# Patient Record
Sex: Male | Born: 1960 | Race: White | Hispanic: No | Marital: Single | Smoking: Former smoker
Health system: Southern US, Community
[De-identification: ages and names within clinical notes are randomized; demographics above are authoritative.]

## PROBLEM LIST (undated history)

## (undated) DIAGNOSIS — F419 Anxiety disorder, unspecified: Secondary | ICD-10-CM

## (undated) DIAGNOSIS — I10 Essential (primary) hypertension: Secondary | ICD-10-CM

## (undated) DIAGNOSIS — M1990 Unspecified osteoarthritis, unspecified site: Secondary | ICD-10-CM

## (undated) HISTORY — PX: WRIST SURGERY: SHX841

## (undated) HISTORY — PX: KNEE ARTHROSCOPY: SUR90

## (undated) HISTORY — PX: COLONOSCOPY: SHX174

---

## 2020-05-07 ENCOUNTER — Other Ambulatory Visit: Payer: Self-pay | Admitting: Physician Assistant

## 2020-05-07 DIAGNOSIS — M25531 Pain in right wrist: Secondary | ICD-10-CM

## 2020-05-08 ENCOUNTER — Ambulatory Visit
Admission: RE | Admit: 2020-05-08 | Discharge: 2020-05-08 | Disposition: A | Discharge status: Home / Self Care | Payer: Self-pay | Source: Ambulatory Visit | Attending: Physician Assistant | Admitting: Physician Assistant

## 2020-05-08 ENCOUNTER — Other Ambulatory Visit: Payer: Self-pay

## 2020-05-08 DIAGNOSIS — M25531 Pain in right wrist: Secondary | ICD-10-CM

## 2020-10-11 ENCOUNTER — Other Ambulatory Visit: Payer: Self-pay | Admitting: Orthopedic Surgery

## 2020-10-11 DIAGNOSIS — M25561 Pain in right knee: Secondary | ICD-10-CM

## 2020-11-07 ENCOUNTER — Other Ambulatory Visit: Payer: Self-pay

## 2020-11-07 ENCOUNTER — Ambulatory Visit
Admission: RE | Admit: 2020-11-07 | Discharge: 2020-11-07 | Disposition: A | Discharge status: Home / Self Care | Payer: Self-pay | Source: Ambulatory Visit | Attending: Orthopedic Surgery | Admitting: Orthopedic Surgery

## 2020-11-07 DIAGNOSIS — M25561 Pain in right knee: Secondary | ICD-10-CM

## 2020-12-27 ENCOUNTER — Other Ambulatory Visit: Payer: Self-pay | Admitting: Orthopedic Surgery

## 2020-12-27 DIAGNOSIS — Z01818 Encounter for other preprocedural examination: Secondary | ICD-10-CM

## 2021-01-09 ENCOUNTER — Encounter (HOSPITAL_COMMUNITY): Payer: Self-pay

## 2021-01-09 NOTE — Progress Notes (Addendum)
COVID Vaccine Completed: x3 Date COVID Vaccine completed:  01-05-20 01-30-20 Has received booster: 09-12-20 COVID vaccine manufacturer: Pfizer      Date of COVID positive in last 90 days: N/A  PCP - Enzo Montgomery. [facility]er in [place] Cardiologist - 10+ years ago due to family history  Chest x-ray - 01-15-21 Epic EKG - 01-15-21 Epic Stress Test - 10+ years ago for family history ECHO -  Cardiac Cath -  Pacemaker/ICD device last checked: Spinal Cord Stimulator:  Sleep Study - N/A CPAP -   Fasting Blood Sugar - N//A Checks Blood Sugar _____ times a day  Blood Thinner Instructions:  N/A Aspirin Instructions: Last Dose:  Activity level:  Can go up a flight of stairs and perform activities of daily living without stopping and without symptoms of chest pain or shortness of breath.       Anesthesia review:  N/A  Patient denies shortness of breath, fever, cough and chest pain at PAT appointment   Patient verbalized understanding of instructions that were given to them at the PAT appointment. Patient was also instructed that they will need to review over the PAT instructions again at home before surgery.

## 2021-01-09 NOTE — Patient Instructions (Addendum)
DUE TO COVID-19 ONLY ONE VISITOR IS ALLOWED TO COME WITH YOU AND STAY IN THE WAITING ROOM ONLY DURING PRE OP AND PROCEDURE.     COVID SWAB TESTING MUST BE COMPLETED ON:  Thursday 01-23-21 @ 10:00 AM   [address] [place], [place] [address]  (Must self quarantine after testing. Follow instructions on handout.)         Your procedure is scheduled on:  Monday, 01-27-21   Report to [facility] Main  Entrance   Report to Short Stay at 5:30 AM   [facility])    Call this number if you have problems the morning of surgery [phone]   Do not eat food :After Midnight.   May have liquids until 4:15 AM day of surgery  CLEAR LIQUID DIET  Foods Allowed                                                                     Foods Excluded  Water, Black Coffee and tea, regular and decaf              liquids that you cannot  Plain Jell-O in any flavor  (No red)                                     see through such as: Fruit ices (not with fruit pulp)                                      milk, soups, orange juice              Iced Popsicles (No red)                                      All solid food                                   Apple juices Sports drinks like Gatorade (No red) Lightly seasoned clear broth or consume(fat free) Sugar, honey syrup   Complete one Ensure drink the morning of surgery at 4:15 AM the day of surgery.       1. The day of surgery:  ? Drink ONE (1) Pre-Surgery Clear Ensure or G2 by am the morning of surgery. Drink in one sitting. Do not sip.  ? This drink was given to you during your hospital  pre-op appointment visit. ? Nothing else to drink after completing the  Pre-Surgery Clear Ensure or G2.          If you have questions, please contact your surgeon's office.     Oral Hygiene is also important to reduce your risk of infection.                                    Remember - BRUSH YOUR TEETH THE MORNING OF SURGERY WITH YOUR  REGULAR  TOOTHPASTE   Do NOT smoke after Midnight   Take these medicines the morning of surgery with A SIP OF WATER:  Amlodipine                               You may not have any metal on your body including  jewelry, and body piercings             Do not wear make-up powders, perfumes/cologne, or deodorant             Men may shave face and neck.   Do not bring valuables to the hospital.  IS NOT             RESPONSIBLE   FOR VALUABLES.   Contacts, dentures or bridgework may not be worn into surgery.   Patients discharged the day of surgery will not be allowed to drive home.               Please read over the following fact sheets you were given: IF YOU HAVE QUESTIONS ABOUT YOUR PRE OP INSTRUCTIONS PLEASE CALL  [phone] [facility] - Preparing for Surgery Before surgery, you can play an important role.  Because skin is not sterile, your skin needs to be as free of germs as possible.  You can reduce the number of germs on your skin by washing with CHG (chlorahexidine gluconate) soap before surgery.  CHG is an antiseptic cleaner which kills germs and bonds with the skin to continue killing germs even after washing. Please DO NOT use if you have an allergy to CHG or antibacterial soaps.  If your skin becomes reddened/irritated stop using the CHG and inform your nurse when you arrive at Short Stay. Do not shave (including legs and underarms) for at least 48 hours prior to the first CHG shower.  You may shave your face/neck.  Please follow these instructions carefully:  1.  Shower with CHG Soap the night before surgery and the  morning of surgery.  2.  If you choose to wash your hair, wash your hair first as usual with your normal  shampoo.  3.  After you shampoo, rinse your hair and body thoroughly to remove the shampoo.                             4.  Use CHG as you would any other liquid soap.  You can apply chg directly to the skin and wash.  Gently with a scrungie or  clean washcloth.  5.  Apply the CHG Soap to your body ONLY FROM THE NECK DOWN.   Do   not use on face/ open                           Wound or open sores. Avoid contact with eyes, ears mouth and   genitals (private parts).                       Wash face,  Genitals (private parts) with your normal soap.             6.  Wash thoroughly, paying special attention to the area where your    surgery  will be performed.  7.  Thoroughly rinse your body with warm water from the neck down.  8.  DO NOT shower/wash with your normal soap after using and rinsing off the CHG Soap.                9.  Pat yourself dry with a clean towel.            10.  Wear clean pajamas.            11.  Place clean sheets on your bed the night of your first shower and do not  sleep with pets. Day of Surgery : Do not apply any lotions/deodorants the morning of surgery.  Please wear clean clothes to the hospital/surgery center.  FAILURE TO FOLLOW THESE INSTRUCTIONS MAY RESULT IN THE CANCELLATION OF YOUR SURGERY  PATIENT SIGNATURE_________________________________  NURSE SIGNATURE__________________________________  ________________________________________________________________________   Brian Mcdonald  An incentive spirometer is a tool that can help keep your lungs clear and active. This tool measures how well you are filling your lungs with each breath. Taking long deep breaths may help reverse or decrease the chance of developing breathing (pulmonary) problems (especially infection) following:  A long period of time when you are unable to move or be active. BEFORE THE PROCEDURE   If the spirometer includes an indicator to show your best effort, your nurse or respiratory therapist will set it to a desired goal.  If possible, sit up straight or lean slightly forward. Try not to slouch.  Hold the incentive spirometer in an upright position. INSTRUCTIONS FOR USE  1. Sit on the edge of your bed if possible, or  sit up as far as you can in bed or on a chair. 2. Hold the incentive spirometer in an upright position. 3. Breathe out normally. 4. Place the mouthpiece in your mouth and seal your lips tightly around it. 5. Breathe in slowly and as deeply as possible, raising the piston or the ball toward the top of the column. 6. Hold your breath for 3-5 seconds or for as long as possible. Allow the piston or ball to fall to the bottom of the column. 7. Remove the mouthpiece from your mouth and breathe out normally. 8. Rest for a few seconds and repeat Steps 1 through 7 at least 10 times every 1-2 hours when you are awake. Take your time and take a few normal breaths between deep breaths. 9. The spirometer may include an indicator to show your best effort. Use the indicator as a goal to work toward during each repetition. 10. After each set of 10 deep breaths, practice coughing to be sure your lungs are clear. If you have an incision (the cut made at the time of surgery), support your incision when coughing by placing a pillow or rolled up towels firmly against it. Once you are able to get out of bed, walk around indoors and cough well. You may stop using the incentive spirometer when instructed by your caregiver.  RISKS AND COMPLICATIONS  Take your time so you do not get dizzy or light-headed.  If you are in pain, you may need to take or ask for pain medication before doing incentive spirometry. It is harder to take a deep breath if you are having pain. AFTER USE  Rest and breathe slowly and easily.  It can be helpful to keep track of a log of your progress. Your caregiver can provide you with a simple table to help with this. If you are using the spirometer at home, follow these instructions: [place] IF:   You  are having difficultly using the spirometer.  You have trouble using the spirometer as often as instructed.  Your pain medication is not giving enough relief while using the  spirometer.  You develop fever of 100.5 F (38.1 C) or higher. SEEK IMMEDIATE MEDICAL CARE IF:   You cough up bloody sputum that had not been present before.  You develop fever of 102 F (38.9 C) or greater.  You develop worsening pain at or near the incision site. MAKE SURE YOU:   Understand these instructions.  Will watch your condition.  Will get help right away if you are not doing well or get worse. Document Released: 03/01/2007 Document Revised: 01/11/2012 Document Reviewed: 05/02/2007 Exit[employer] Patient Information 2014 [employer], [place].   ________________________________________________________________________  WHAT IS A BLOOD TRANSFUSION? Blood Transfusion Information  A transfusion is the replacement of blood or some of its parts. Blood is made up of multiple cells which provide different functions.  Red blood cells carry oxygen and are used for blood loss replacement.  White blood cells fight against infection.  Platelets control bleeding.  Plasma helps clot blood.  Other blood products are available for specialized needs, such as hemophilia or other clotting disorders. BEFORE THE TRANSFUSION  Who gives blood for transfusions?   Healthy volunteers who are fully evaluated to make sure their blood is safe. This is blood bank blood. Transfusion therapy is the safest it has ever been in the practice of medicine. Before blood is taken from a donor, a complete history is taken to make sure that person has no history of diseases nor engages in risky social behavior (examples are intravenous drug use or sexual activity with multiple partners). The donor's travel history is screened to minimize risk of transmitting infections, such as malaria. The donated blood is tested for signs of infectious diseases, such as HIV and hepatitis. The blood is then tested to be sure it is compatible with you in order to minimize the chance of a transfusion reaction. If you or a relative  donates blood, this is often done in anticipation of surgery and is not appropriate for emergency situations. It takes many days to process the donated blood. RISKS AND COMPLICATIONS Although transfusion therapy is very safe and saves many lives, the main dangers of transfusion include:   Getting an infectious disease.  Developing a transfusion reaction. This is an allergic reaction to something in the blood you were given. Every precaution is taken to prevent this. The decision to have a blood transfusion has been considered carefully by your caregiver before blood is given. Blood is not given unless the benefits outweigh the risks. AFTER THE TRANSFUSION  Right after receiving a blood transfusion, you will usually feel much better and more energetic. This is especially true if your red blood cells have gotten low (anemic). The transfusion raises the level of the red blood cells which carry oxygen, and this usually causes an energy increase.  The nurse administering the transfusion will monitor you carefully for complications. HOME CARE INSTRUCTIONS  No special instructions are needed after a transfusion. You may find your energy is better. Speak with your caregiver about any limitations on activity for underlying diseases you may have. SEEK MEDICAL CARE IF:   Your condition is not improving after your transfusion.  You develop redness or irritation at the intravenous (IV) site. SEEK IMMEDIATE MEDICAL CARE IF:  Any of the following symptoms occur over the next 12 hours:  Shaking chills.  You have a temperature by  mouth above 102 F (38.9 C), not controlled by medicine.  Chest, back, or muscle pain.  People around you feel you are not acting correctly or are confused.  Shortness of breath or difficulty breathing.  Dizziness and fainting.  You get a rash or develop hives.  You have a decrease in urine output.  Your urine turns a dark color or changes to pink, red, or brown. Any  of the following symptoms occur over the next 10 days:  You have a temperature by mouth above 102 F (38.9 C), not controlled by medicine.  Shortness of breath.  Weakness after normal activity.  The white part of the eye turns yellow (jaundice).  You have a decrease in the amount of urine or are urinating less often.  Your urine turns a dark color or changes to pink, red, or brown. Document Released: 10/16/2000 Document Revised: 01/11/2012 Document Reviewed: 06/04/2008 [facility] Patient Information 2014 [place], [place].  _______________________________________________________________________

## 2021-01-15 ENCOUNTER — Encounter (HOSPITAL_COMMUNITY)
Admission: RE | Admit: 2021-01-15 | Discharge: 2021-01-15 | Disposition: A | Discharge status: Home / Self Care | Payer: No Typology Code available for payment source | Source: Ambulatory Visit | Attending: Orthopedic Surgery | Admitting: Orthopedic Surgery

## 2021-01-15 ENCOUNTER — Ambulatory Visit (HOSPITAL_COMMUNITY)
Admission: RE | Admit: 2021-01-15 | Discharge: 2021-01-15 | Disposition: A | Discharge status: Home / Self Care | Payer: No Typology Code available for payment source | Source: Ambulatory Visit | Attending: Orthopedic Surgery | Admitting: Orthopedic Surgery

## 2021-01-15 ENCOUNTER — Encounter (HOSPITAL_COMMUNITY): Payer: Self-pay

## 2021-01-15 ENCOUNTER — Other Ambulatory Visit: Payer: Self-pay

## 2021-01-15 DIAGNOSIS — Z01818 Encounter for other preprocedural examination: Secondary | ICD-10-CM

## 2021-01-15 HISTORY — DX: Unspecified osteoarthritis, unspecified site: M19.90

## 2021-01-15 HISTORY — DX: Anxiety disorder, unspecified: F41.9

## 2021-01-15 HISTORY — DX: Essential (primary) hypertension: I10

## 2021-01-15 LAB — BASIC METABOLIC PANEL
Anion gap: 8 (ref 5–15)
BUN: 16 mg/dL (ref 6–20)
CO2: 24 mmol/L (ref 22–32)
Calcium: 9.2 mg/dL (ref 8.9–10.3)
Chloride: 104 mmol/L (ref 98–111)
Creatinine, Ser: 0.88 mg/dL (ref 0.61–1.24)
GFR, Estimated: >60 mL/min (ref >60)
Glucose, Bld: 85 mg/dL (ref 70–99)
Potassium: 4.2 mmol/L (ref 3.5–5.1)
Sodium: 136 mmol/L (ref 135–145)

## 2021-01-15 LAB — APTT: aPTT: 28 seconds (ref 24–36)

## 2021-01-15 LAB — URINALYSIS, ROUTINE W REFLEX MICROSCOPIC
Bilirubin Urine: NEGATIVE
Glucose, UA: NEGATIVE mg/dL
Hgb urine dipstick: NEGATIVE
Ketones, ur: 5 mg/dL — AB
Leukocytes,Ua: NEGATIVE
Nitrite: NEGATIVE
Protein, ur: NEGATIVE mg/dL
Specific Gravity, Urine: 1.028 (ref 1.005–1.030)
pH: 5 (ref 5.0–8.0)

## 2021-01-15 LAB — TYPE AND SCREEN
ABO/RH(D): B POS
Antibody Screen: NEGATIVE

## 2021-01-15 LAB — CBC WITH DIFFERENTIAL/PLATELET
Abs Immature Granulocytes: 0.02 10*3/uL (ref 0.00–0.07)
Basophils Absolute: 0.1 10*3/uL (ref 0.0–0.1)
Basophils Relative: 1 %
Eosinophils Absolute: 0.1 10*3/uL (ref 0.0–0.5)
Eosinophils Relative: 2 %
HCT: 46.5 % (ref 39.0–52.0)
Hemoglobin: 15.7 g/dL (ref 13.0–17.0)
Immature Granulocytes: 0 %
Lymphocytes Relative: 20 %
Lymphs Abs: 1.6 10*3/uL (ref 0.7–4.0)
MCH: 29.9 pg (ref 26.0–34.0)
MCHC: 33.8 g/dL (ref 30.0–36.0)
MCV: 88.6 fL (ref 80.0–100.0)
Monocytes Absolute: 0.6 10*3/uL (ref 0.1–1.0)
Monocytes Relative: 8 %
Neutro Abs: 5.3 10*3/uL (ref 1.7–7.7)
Neutrophils Relative %: 69 %
Platelets: 318 10*3/uL (ref 150–400)
RBC: 5.25 MIL/uL (ref 4.22–5.81)
RDW: 12.4 % (ref 11.5–15.5)
WBC: 7.7 10*3/uL (ref 4.0–10.5)
nRBC: 0 % (ref 0.0–0.2)

## 2021-01-15 LAB — SURGICAL PCR SCREEN
MRSA, PCR: NEGATIVE
Staphylococcus aureus: POSITIVE — AB

## 2021-01-15 LAB — PROTIME-INR
INR: 1 (ref 0.8–1.2)
Prothrombin Time: 12.4 seconds (ref 11.4–15.2)

## 2021-01-15 NOTE — Progress Notes (Signed)
PCR results sent to Dr. Rowan to review. 

## 2021-01-23 ENCOUNTER — Other Ambulatory Visit (HOSPITAL_COMMUNITY)
Admission: RE | Admit: 2021-01-23 | Discharge: 2021-01-23 | Disposition: A | Discharge status: Home / Self Care | Payer: No Typology Code available for payment source | Source: Ambulatory Visit | Attending: Orthopedic Surgery | Admitting: Orthopedic Surgery

## 2021-01-23 DIAGNOSIS — Z20822 Contact with and (suspected) exposure to covid-19: Secondary | ICD-10-CM | POA: Diagnosis not present

## 2021-01-23 DIAGNOSIS — Z01812 Encounter for preprocedural laboratory examination: Secondary | ICD-10-CM | POA: Diagnosis present

## 2021-01-23 DIAGNOSIS — M1711 Unilateral primary osteoarthritis, right knee: Secondary | ICD-10-CM | POA: Diagnosis present

## 2021-01-23 LAB — SARS CORONAVIRUS 2 (TAT 6-24 HRS): SARS Coronavirus 2: NEGATIVE

## 2021-01-23 NOTE — H&P (Signed)
TOTAL KNEE ADMISSION H&P  Patient is being admitted for right total knee arthroplasty.  Subjective:  Chief Complaint:right knee pain.  HPI: Brian Mcdonald, 60 y.o. male, has a history of pain and functional disability in the right knee due to trauma and arthritis and has failed non-surgical conservative treatments for greater than 12 weeks to includeNSAID's and/or analgesics, corticosteriod injections, flexibility and strengthening excercises, supervised PT with diminished ADL's post treatment and activity modification.  Onset of symptoms was abrupt, starting 1 years ago with rapidlly worsening course since that time. The patient noted prior procedures on the knee to include  arthroscopy on the right knee(s).  Patient currently rates pain in the right knee(s) at 10 out of 10 with activity. Patient has night pain, worsening of pain with activity and weight bearing, pain that interferes with activities of daily living, pain with passive range of motion, crepitus and joint swelling.  Patient has evidence of joint space narrowing by imaging studies.  There is no active infection.  Patient Active Problem List   Diagnosis Date Noted  . Osteoarthritis of right knee 01/23/2021   Past Medical History:  Diagnosis Date  . Anxiety   . Arthritis   . Hypertension     Past Surgical History:  Procedure Laterality Date  . COLONOSCOPY    . KNEE ARTHROSCOPY Right   . WRIST SURGERY Right   . WRIST SURGERY Left     No current facility-administered medications for this encounter.   Current Outpatient Medications  Medication Sig Dispense Refill Last Dose  . acetaminophen (TYLENOL) 500 MG tablet Take 1,000 mg by mouth every 6 (six) hours as needed (for pain).     Marland Kitchen amLODipine (NORVASC) 5 MG tablet Take 5 mg by mouth daily.     . Cholecalciferol (VITAMIN D3) 10 MCG (400 UNIT) tablet Take 400 Units by mouth daily.     Marland Kitchen escitalopram (LEXAPRO) 10 MG tablet Take 10 mg by mouth at bedtime.     Marland Kitchen ibuprofen  (ADVIL) 200 MG tablet Take 400 mg by mouth every 8 (eight) hours as needed (for pain).     Marland Kitchen lisinopril (ZESTRIL) 30 MG tablet Take 30 mg by mouth daily.     . vitamin C (ASCORBIC ACID) 500 MG tablet Take 500 mg by mouth daily.      No Known Allergies  Social History   Tobacco Use  . Smoking status: Former Games developer  . Smokeless tobacco: Never Used  . Tobacco comment: Quit 20 years ago  Substance Use Topics  . Alcohol use: Never    No family history on file.   Review of Systems  Constitutional: Negative.   HENT: Negative.   Eyes: Negative.   Respiratory: Negative.   Cardiovascular:       HTN  Gastrointestinal: Negative.   Endocrine: Negative.   Genitourinary: Negative.   Musculoskeletal: Positive for arthralgias and myalgias.  Skin: Negative.   Allergic/Immunologic: Negative.   Neurological: Negative.   Hematological: Negative.   Psychiatric/Behavioral: Negative.     Objective:  Physical Exam Constitutional:      Appearance: Normal appearance. He is normal weight.  HENT:     Head: Normocephalic and atraumatic.     Nose: Nose normal.  Eyes:     Pupils: Pupils are equal, round, and reactive to light.  Cardiovascular:     Pulses: Normal pulses.  Pulmonary:     Effort: Pulmonary effort is normal.  Musculoskeletal:        General: Tenderness present.  Cervical back: Normal range of motion and neck supple.     Comments: Today the patient has a 1+ effusion in the knee mild valgus deformity pain laterally to palpation and a flexion past 120.  Collateral ligaments are stable to valgus stress exacerbates his pain.  Neurovascular intact distally toes are pink and well perfused good quadriceps and hamstring power.  He does walk with a right-sided limp.  Neurological:     General: No focal deficit present.     Mental Status: He is alert and oriented to person, place, and time. Mental status is at baseline.  Psychiatric:        Mood and Affect: Mood normal.         Behavior: Behavior normal.        Thought Content: Thought content normal.        Judgment: Judgment normal.     Vital signs in last 24 hours:    Labs:   Estimated body mass index is 31.33 kg/m as calculated from the following:   Height as of 01/15/21: 6' (1.829 m).   Weight as of 01/15/21: 104.8 kg.   Imaging Review Plain radiographs demonstrate  AP and Zoila Shutter x-rays taken today show partial collapse of the lateral femoral condyle on the Zoila Shutter view that was not present when x-rays are done back in July 2021 when we first saw him.  This represents rapid progression of arthritis with loss of cartilage and bone.   Assessment/Plan:  End stage arthritis, right knee   The patient history, physical examination, clinical judgment of the provider and imaging studies are consistent with end stage degenerative joint disease of the right knee(s) and total knee arthroplasty is deemed medically necessary. The treatment options including medical management, injection therapy arthroscopy and arthroplasty were discussed at length. The risks and benefits of total knee arthroplasty were presented and reviewed. The risks due to aseptic loosening, infection, stiffness, patella tracking problems, thromboembolic complications and other imponderables were discussed. The patient acknowledged the explanation, agreed to proceed with the plan and consent was signed. Patient is being admitted for inpatient treatment for surgery, pain control, PT, OT, prophylactic antibiotics, VTE prophylaxis, progressive ambulation and ADL's and discharge planning. The patient is planning to be discharged home with home health services     Patient's anticipated LOS is less than 2 midnights, meeting these requirements: - Younger than 43 - Lives within 1 hour of care - Has a competent adult at home to recover with post-op recover - NO history of  - Chronic pain requiring opiods  - Diabetes  - Coronary Artery Disease  -  Heart failure  - Heart attack  - Stroke  - DVT/VTE  - Cardiac arrhythmia  - Respiratory Failure/COPD  - Renal failure  - Anemia  - Advanced Liver disease

## 2021-01-25 NOTE — Anesthesia Preprocedure Evaluation (Addendum)
Anesthesia Evaluation  Patient identified by MRN, date of birth, ID band Patient awake    Reviewed: Allergy & Precautions, NPO status , Patient's Chart, lab work & pertinent test results  Airway Mallampati: II  TM Distance: >3 FB Neck ROM: Full    Dental no notable dental hx. (+) Partial Upper,    Pulmonary former smoker,    Pulmonary exam normal breath sounds clear to auscultation       Cardiovascular hypertension, Pt. on medications Normal cardiovascular exam Rhythm:Regular Rate:Normal  EKG 01-15-21 SR R 67    Neuro/Psych Anxiety negative neurological ROS     GI/Hepatic Neg liver ROS,   Endo/Other  negative endocrine ROS  Renal/GU negative Renal ROSK+ 4.2     Musculoskeletal  (+) Arthritis ,   Abdominal (+) + obese,   Peds  Hematology negative hematology ROS (+) Hgb 15.7 Plt 318   Anesthesia Other Findings   Reproductive/Obstetrics negative OB ROS                            Anesthesia Physical Anesthesia Plan  ASA: II  Anesthesia Plan: Spinal   Post-op Pain Management:    Induction:   PONV Risk Score and Plan: 2 and Treatment may vary due to age or medical condition  Airway Management Planned: Natural Airway and Nasal Cannula  Additional Equipment: None  Intra-op Plan:   Post-operative Plan:   Informed Consent: I have reviewed the patients History and Physical, chart, labs and discussed the procedure including the risks, benefits and alternatives for the proposed anesthesia with the patient or authorized representative who has indicated his/her understanding and acceptance.     Dental advisory given  Plan Discussed with: Anesthesiologist and CRNA  Anesthesia Plan Comments: (SP w R adductor canal Block)       Anesthesia Quick Evaluation

## 2021-01-26 MED ORDER — TRANEXAMIC ACID 1000 MG/10ML IV SOLN
2000.0000 mg | INTRAVENOUS | Status: DC
  Filled 2021-01-26: qty 20

## 2021-01-26 MED ORDER — BUPIVACAINE LIPOSOME 1.3 % IJ SUSP
20.0000 mL | INTRAMUSCULAR | Status: DC
  Filled 2021-01-26: qty 20

## 2021-01-27 ENCOUNTER — Ambulatory Visit (HOSPITAL_COMMUNITY)
Admission: RE | Admit: 2021-01-27 | Discharge: 2021-01-27 | Disposition: A | Discharge status: Home / Self Care | Payer: No Typology Code available for payment source | Source: Ambulatory Visit | Attending: Orthopedic Surgery | Admitting: Orthopedic Surgery

## 2021-01-27 ENCOUNTER — Encounter (HOSPITAL_COMMUNITY): Payer: Self-pay | Admitting: Orthopedic Surgery

## 2021-01-27 ENCOUNTER — Encounter (HOSPITAL_COMMUNITY)
Admission: RE | Disposition: A | Discharge status: Home / Self Care | Payer: Self-pay | Source: Ambulatory Visit | Attending: Orthopedic Surgery

## 2021-01-27 ENCOUNTER — Ambulatory Visit (HOSPITAL_COMMUNITY): Payer: No Typology Code available for payment source | Admitting: Anesthesiology

## 2021-01-27 DIAGNOSIS — Z79899 Other long term (current) drug therapy: Secondary | ICD-10-CM | POA: Insufficient documentation

## 2021-01-27 DIAGNOSIS — M1711 Unilateral primary osteoarthritis, right knee: Secondary | ICD-10-CM | POA: Diagnosis present

## 2021-01-27 DIAGNOSIS — M6281 Muscle weakness (generalized): Secondary | ICD-10-CM | POA: Insufficient documentation

## 2021-01-27 DIAGNOSIS — Z87891 Personal history of nicotine dependence: Secondary | ICD-10-CM | POA: Insufficient documentation

## 2021-01-27 HISTORY — PX: TOTAL KNEE ARTHROPLASTY: SHX125

## 2021-01-27 LAB — ABO/RH: ABO/RH(D): B POS

## 2021-01-27 SURGERY — ARTHROPLASTY, KNEE, TOTAL
Anesthesia: Spinal | Site: Knee | Laterality: Right

## 2021-01-27 MED ORDER — OXYCODONE HCL 5 MG PO TABS
5.0000 mg | ORAL_TABLET | Freq: Once | ORAL | Status: AC | PRN
  Administered 2021-01-27: 5 mg via ORAL

## 2021-01-27 MED ORDER — LACTATED RINGERS IV SOLN: INTRAVENOUS | Status: DC

## 2021-01-27 MED ORDER — DEXAMETHASONE SODIUM PHOSPHATE 10 MG/ML IJ SOLN
INTRAMUSCULAR | Status: AC
  Filled 2021-01-27: qty 1

## 2021-01-27 MED ORDER — BUPIVACAINE-EPINEPHRINE (PF) 0.25% -1:200000 IJ SOLN
INTRAMUSCULAR | Status: AC
  Filled 2021-01-27: qty 30

## 2021-01-27 MED ORDER — ONDANSETRON HCL 4 MG/2ML IJ SOLN
INTRAMUSCULAR | Status: AC
  Filled 2021-01-27: qty 2

## 2021-01-27 MED ORDER — OXYCODONE HCL 5 MG PO TABS
ORAL_TABLET | ORAL | Status: AC
  Filled 2021-01-27: qty 1

## 2021-01-27 MED ORDER — DEXAMETHASONE SODIUM PHOSPHATE 10 MG/ML IJ SOLN
INTRAMUSCULAR | Status: DC | PRN
  Administered 2021-01-27: 5 mg via INTRAVENOUS

## 2021-01-27 MED ORDER — CLONIDINE HCL (ANALGESIA) 100 MCG/ML EP SOLN
EPIDURAL | Status: DC | PRN
  Administered 2021-01-27: 100 ug

## 2021-01-27 MED ORDER — TRANEXAMIC ACID 1000 MG/10ML IV SOLN
INTRAVENOUS | Status: DC | PRN
  Administered 2021-01-27: 2000 mg via TOPICAL

## 2021-01-27 MED ORDER — BUPIVACAINE-EPINEPHRINE (PF) 0.25% -1:200000 IJ SOLN
INTRAMUSCULAR | Status: DC | PRN
  Administered 2021-01-27: 30 mL

## 2021-01-27 MED ORDER — ONDANSETRON HCL 4 MG/2ML IJ SOLN
INTRAMUSCULAR | Status: DC | PRN
  Administered 2021-01-27: 4 mg via INTRAVENOUS

## 2021-01-27 MED ORDER — PROPOFOL 500 MG/50ML IV EMUL
INTRAVENOUS | Status: AC
  Filled 2021-01-27: qty 50

## 2021-01-27 MED ORDER — CHLORHEXIDINE GLUCONATE 0.12 % MT SOLN
15.0000 mL | Freq: Once | OROMUCOSAL | Status: AC
  Administered 2021-01-27: 15 mL via OROMUCOSAL

## 2021-01-27 MED ORDER — PROPOFOL 10 MG/ML IV BOLUS
INTRAVENOUS | Status: AC
  Filled 2021-01-27: qty 20

## 2021-01-27 MED ORDER — AMISULPRIDE (ANTIEMETIC) 5 MG/2ML IV SOLN: 10.0000 mg | Freq: Once | INTRAVENOUS | Status: DC | PRN

## 2021-01-27 MED ORDER — ACETAMINOPHEN 10 MG/ML IV SOLN: 1000.0000 mg | Freq: Once | INTRAVENOUS | Status: DC | PRN

## 2021-01-27 MED ORDER — SODIUM CHLORIDE (PF) 0.9 % IJ SOLN
INTRAMUSCULAR | Status: AC
  Filled 2021-01-27: qty 200

## 2021-01-27 MED ORDER — LACTATED RINGERS IV BOLUS
500.0000 mL | Freq: Once | INTRAVENOUS | Status: AC
  Administered 2021-01-27: 500 mL via INTRAVENOUS

## 2021-01-27 MED ORDER — ONDANSETRON HCL 4 MG/2ML IJ SOLN: 4.0000 mg | Freq: Once | INTRAMUSCULAR | Status: DC | PRN

## 2021-01-27 MED ORDER — METHOCARBAMOL 500 MG IVPB - SIMPLE MED: 500.0000 mg | Freq: Four times a day (QID) | INTRAVENOUS | Status: DC | PRN

## 2021-01-27 MED ORDER — BUPIVACAINE IN DEXTROSE 0.75-8.25 % IT SOLN
INTRATHECAL | Status: DC | PRN
  Administered 2021-01-27: 1.7 mL via INTRATHECAL

## 2021-01-27 MED ORDER — SODIUM CHLORIDE 0.9 % IR SOLN
Status: DC | PRN
  Administered 2021-01-27: 1000 mL

## 2021-01-27 MED ORDER — PROPOFOL 500 MG/50ML IV EMUL
INTRAVENOUS | Status: DC | PRN
  Administered 2021-01-27: 75 ug/kg/min via INTRAVENOUS

## 2021-01-27 MED ORDER — PROPOFOL 10 MG/ML IV BOLUS
INTRAVENOUS | Status: DC | PRN
  Administered 2021-01-27 (×2): 20 mg via INTRAVENOUS

## 2021-01-27 MED ORDER — FENTANYL CITRATE (PF) 100 MCG/2ML IJ SOLN
INTRAMUSCULAR | Status: AC
  Filled 2021-01-27: qty 2

## 2021-01-27 MED ORDER — POVIDONE-IODINE 10 % EX SWAB
2.0000 "application " | Freq: Once | CUTANEOUS | Status: AC
  Administered 2021-01-27: 2 via TOPICAL

## 2021-01-27 MED ORDER — MIDAZOLAM HCL 2 MG/2ML IJ SOLN
INTRAMUSCULAR | Status: AC
  Filled 2021-01-27: qty 2

## 2021-01-27 MED ORDER — FENTANYL CITRATE (PF) 100 MCG/2ML IJ SOLN
INTRAMUSCULAR | Status: DC | PRN
  Administered 2021-01-27 (×2): 50 ug via INTRAVENOUS

## 2021-01-27 MED ORDER — ROPIVACAINE HCL 7.5 MG/ML IJ SOLN
INTRAMUSCULAR | Status: DC | PRN
  Administered 2021-01-27: 20 mL via PERINEURAL

## 2021-01-27 MED ORDER — OXYCODONE-ACETAMINOPHEN 5-325 MG PO TABS: 1.0000 | ORAL_TABLET | ORAL | 0 refills | Status: AC | PRN

## 2021-01-27 MED ORDER — BUPIVACAINE LIPOSOME 1.3 % IJ SUSP
INTRAMUSCULAR | Status: DC | PRN
  Administered 2021-01-27: 20 mL

## 2021-01-27 MED ORDER — TRANEXAMIC ACID-NACL 1000-0.7 MG/100ML-% IV SOLN
INTRAVENOUS | Status: AC
  Filled 2021-01-27: qty 100

## 2021-01-27 MED ORDER — TIZANIDINE HCL 2 MG PO TABS: 2.0000 mg | ORAL_TABLET | Freq: Four times a day (QID) | ORAL | 0 refills | Status: AC | PRN

## 2021-01-27 MED ORDER — OXYCODONE HCL 5 MG/5ML PO SOLN: 5.0000 mg | Freq: Once | ORAL | Status: AC | PRN

## 2021-01-27 MED ORDER — EPHEDRINE 5 MG/ML INJ
INTRAVENOUS | Status: AC
  Filled 2021-01-27: qty 10

## 2021-01-27 MED ORDER — TRANEXAMIC ACID-NACL 1000-0.7 MG/100ML-% IV SOLN
1000.0000 mg | INTRAVENOUS | Status: AC
  Administered 2021-01-27: 1000 mg via INTRAVENOUS
  Filled 2021-01-27: qty 100

## 2021-01-27 MED ORDER — MIDAZOLAM HCL 5 MG/5ML IJ SOLN
INTRAMUSCULAR | Status: DC | PRN
  Administered 2021-01-27 (×2): 1 mg via INTRAVENOUS

## 2021-01-27 MED ORDER — ORAL CARE MOUTH RINSE: 15.0000 mL | Freq: Once | OROMUCOSAL | Status: AC

## 2021-01-27 MED ORDER — TRANEXAMIC ACID-NACL 1000-0.7 MG/100ML-% IV SOLN
1000.0000 mg | Freq: Once | INTRAVENOUS | Status: AC
  Administered 2021-01-27: 1000 mg via INTRAVENOUS

## 2021-01-27 MED ORDER — SODIUM CHLORIDE (PF) 0.9 % IJ SOLN
INTRAMUSCULAR | Status: DC | PRN
  Administered 2021-01-27: 70 mL

## 2021-01-27 MED ORDER — 0.9 % SODIUM CHLORIDE (POUR BTL) OPTIME
TOPICAL | Status: DC | PRN
  Administered 2021-01-27: 1000 mL

## 2021-01-27 MED ORDER — EPHEDRINE SULFATE-NACL 50-0.9 MG/10ML-% IV SOSY
PREFILLED_SYRINGE | INTRAVENOUS | Status: DC | PRN
  Administered 2021-01-27 (×3): 10 mg via INTRAVENOUS

## 2021-01-27 MED ORDER — ASPIRIN EC 81 MG PO TBEC: 81.0000 mg | DELAYED_RELEASE_TABLET | Freq: Two times a day (BID) | ORAL | 0 refills | Status: AC

## 2021-01-27 MED ORDER — METHOCARBAMOL 500 MG PO TABS: 500.0000 mg | ORAL_TABLET | Freq: Four times a day (QID) | ORAL | Status: DC | PRN

## 2021-01-27 MED ORDER — LACTATED RINGERS IV BOLUS
250.0000 mL | Freq: Once | INTRAVENOUS | Status: AC
  Administered 2021-01-27: 250 mL via INTRAVENOUS

## 2021-01-27 MED ORDER — WATER FOR IRRIGATION, STERILE IR SOLN
Status: DC | PRN
  Administered 2021-01-27: 2000 mL

## 2021-01-27 MED ORDER — CEFAZOLIN SODIUM-DEXTROSE 2-4 GM/100ML-% IV SOLN
2.0000 g | INTRAVENOUS | Status: AC
  Administered 2021-01-27: 2 g via INTRAVENOUS
  Filled 2021-01-27: qty 100

## 2021-01-27 MED ORDER — HYDROMORPHONE HCL 1 MG/ML IJ SOLN: 0.2500 mg | INTRAMUSCULAR | Status: DC | PRN

## 2021-01-27 SURGICAL SUPPLY — 51 items
ATTUNE MED DOME PAT 41 KNEE (Knees) ×2 IMPLANT
ATTUNE PS FEM RT SZ 8 CEM KNEE (Femur) ×2 IMPLANT
ATTUNE PSRP INSR SZ8 5 KNEE (Insert) ×2 IMPLANT
BAG DECANTER FOR FLEXI CONT (MISCELLANEOUS) ×2 IMPLANT
BAG ZIPLOCK 12X15 (MISCELLANEOUS) ×2 IMPLANT
BASE TIBIAL ROT PLAT SZ 8 KNEE (Knees) ×1 IMPLANT
BLADE SAG 18X100X1.27 (BLADE) ×2 IMPLANT
BLADE SAW SGTL 11.0X1.19X90.0M (BLADE) ×2 IMPLANT
BLADE SURG SZ10 CARB STEEL (BLADE) ×4 IMPLANT
BNDG ELASTIC 6X10 VLCR STRL LF (GAUZE/BANDAGES/DRESSINGS) ×2 IMPLANT
BNDG ELASTIC 6X5.8 VLCR STR LF (GAUZE/BANDAGES/DRESSINGS) ×2 IMPLANT
BOWL SMART MIX CTS (DISPOSABLE) ×2 IMPLANT
CEMENT HV SMART SET (Cement) ×4 IMPLANT
COVER SURGICAL LIGHT HANDLE (MISCELLANEOUS) ×2 IMPLANT
COVER WAND RF STERILE (DRAPES) IMPLANT
CUFF TOURN SGL QUICK 34 (TOURNIQUET CUFF) ×1
CUFF TRNQT CYL 34X4.125X (TOURNIQUET CUFF) ×1 IMPLANT
DECANTER SPIKE VIAL GLASS SM (MISCELLANEOUS) ×6 IMPLANT
DRAPE ORTHO SPLIT 77X108 STRL (DRAPES)
DRAPE SURG ORHT 6 SPLT 77X108 (DRAPES) IMPLANT
DRAPE U-SHAPE 47X51 STRL (DRAPES) ×2 IMPLANT
DRSG AQUACEL AG ADV 3.5X10 (GAUZE/BANDAGES/DRESSINGS) ×2 IMPLANT
DURAPREP 26ML APPLICATOR (WOUND CARE) ×2 IMPLANT
ELECT REM PT RETURN 15FT ADLT (MISCELLANEOUS) ×2 IMPLANT
GLOVE SRG 8 PF TXTR STRL LF DI (GLOVE) ×1 IMPLANT
GLOVE SURG ENC MOIS LTX SZ7.5 (GLOVE) ×2 IMPLANT
GLOVE SURG ENC MOIS LTX SZ8.5 (GLOVE) ×2 IMPLANT
GLOVE SURG UNDER POLY LF SZ8 (GLOVE) ×1
GLOVE SURG UNDER POLY LF SZ9 (GLOVE) ×2 IMPLANT
GOWN STRL REUS W/TWL XL LVL3 (GOWN DISPOSABLE) ×4 IMPLANT
HANDPIECE INTERPULSE COAX TIP (DISPOSABLE) ×1
HOOD PEEL AWAY FLYTE STAYCOOL (MISCELLANEOUS) ×6 IMPLANT
KIT TURNOVER KIT A (KITS) ×2 IMPLANT
NEEDLE HYPO 21X1.5 SAFETY (NEEDLE) ×4 IMPLANT
NS IRRIG 1000ML POUR BTL (IV SOLUTION) ×2 IMPLANT
PACK ICE MAXI GEL EZY WRAP (MISCELLANEOUS) ×2 IMPLANT
PACK TOTAL KNEE CUSTOM (KITS) ×2 IMPLANT
PENCIL SMOKE EVACUATOR (MISCELLANEOUS) IMPLANT
PIN DRILL FIX HALF THREAD (BIT) ×2 IMPLANT
PIN STEINMAN FIXATION KNEE (PIN) ×2 IMPLANT
PROTECTOR NERVE ULNAR (MISCELLANEOUS) ×2 IMPLANT
SET HNDPC FAN SPRY TIP SCT (DISPOSABLE) ×1 IMPLANT
SUT VIC AB 1 CTX 36 (SUTURE) ×1
SUT VIC AB 1 CTX36XBRD ANBCTR (SUTURE) ×1 IMPLANT
SUT VIC AB 3-0 CT1 27 (SUTURE) ×3
SUT VIC AB 3-0 CT1 TAPERPNT 27 (SUTURE) ×3 IMPLANT
SYR CONTROL 10ML LL (SYRINGE) ×4 IMPLANT
TIBIAL BASE ROT PLAT SZ 8 KNEE (Knees) ×2 IMPLANT
TRAY FOLEY MTR SLVR 16FR STAT (SET/KITS/TRAYS/PACK) ×2 IMPLANT
WATER STERILE IRR 1000ML POUR (IV SOLUTION) ×4 IMPLANT
WRAP KNEE MAXI GEL POST OP (GAUZE/BANDAGES/DRESSINGS) ×2 IMPLANT

## 2021-01-27 NOTE — Transfer of Care (Signed)
Immediate Anesthesia Transfer of Care Note  Patient: Brian Mcdonald  Procedure(s) Performed: RIGHT TOTAL KNEE ARTHROPLASTY (Right Knee)  Patient Location: PACU  Anesthesia Type:Spinal and MAC combined with regional for post-op pain  Level of Consciousness: awake, alert  and oriented  Airway & Oxygen Therapy: Patient Spontanous Breathing and Patient connected to face mask oxygen  Post-op Assessment: Report given to RN and Post -op Vital signs reviewed and stable  Post vital signs: Reviewed and stable  Last Vitals:  Vitals Value Taken Time  BP 103/64 01/27/21 0911  Temp    Pulse 64 01/27/21 0913  Resp 13 01/27/21 0913  SpO2 99 % 01/27/21 0913  Vitals shown include unvalidated device data.  Last Pain:  Vitals:   01/27/21 0619  TempSrc:   PainSc: 0-No pain         Complications: No complications documented.

## 2021-01-27 NOTE — Interval H&P Note (Signed)
History and Physical Interval Note:  01/27/2021 6:53 AM  Brian Mcdonald  has presented today for surgery, with the diagnosis of RIGHT KNEE OSTEOARTHRITIS.  The various methods of treatment have been discussed with the patient and family. After consideration of risks, benefits and other options for treatment, the patient has consented to  Procedure(s): RIGHT TOTAL KNEE ARTHROPLASTY (Right) as a surgical intervention.  The patient's history has been reviewed, patient examined, no change in status, stable for surgery.  I have reviewed the patient's chart and labs.  Questions were answered to the patient's satisfaction.     [name]

## 2021-01-27 NOTE — Op Note (Signed)
PATIENT ID:      Brian Mcdonald  MRN:     [id number] DOB/AGE:    1961-03-11 / 60 y.o.       OPERATIVE REPORT   DATE OF PROCEDURE:  01/27/2021      PREOPERATIVE DIAGNOSIS:   RIGHT KNEE OSTEOARTHRITIS      Estimated body mass index is 31.33 kg/m as calculated from the following:   Height as of 01/15/21: 6' (1.829 m).   Weight as of this encounter: 104.8 kg.                                                       POSTOPERATIVE DIAGNOSIS:   Same                                                                  PROCEDURE:  Procedure(s): RIGHT TOTAL KNEE ARTHROPLASTY Using DepuyAttune RP implants #8R Femur, #8Tibia, 5 mm Attune RP bearing, 41 Patella    SURGEON: [name]  ASSISTANT:   Tomi Likens. [employer]   (Present and scrubbed throughout the case, critical for assistance with exposure, retraction, instrumentation, and closure.)        ANESTHESIA: spinal, 20cc Exparel, 50cc 0.25% Marcaine EBL: 300 cc FLUID REPLACEMENT: 1500 cc crystaloid TOURNIQUET: DRAINS: None TRANEXAMIC ACID: 1gm IV, 2gm topical COMPLICATIONS:  None         INDICATIONS FOR PROCEDURE: The patient has  RIGHT KNEE OSTEOARTHRITIS, Vak deformities, XR shows bone on bone arthritis, lateral subluxation of tibia. Patient has failed all conservative measures including anti-inflammatory medicines, narcotics, attempts at exercise and weight loss, cortisone injections and viscosupplementation.  Risks and benefits of surgery have been discussed, questions answered.   DESCRIPTION OF PROCEDURE: The patient identified by armband, received  IV antibiotics, in the holding area at [facility]. Patient taken to the operating room, appropriate anesthetic monitors were attached, and Spinal anesthesia was  induced. IV Tranexamic acid was given.Tourniquet applied high to the operative thigh. Lateral post and foot positioner applied to the table, the lower extremity was then prepped and draped in usual sterile fashion from the toes to  the tourniquet. Time-out procedure was performed. Tomi Likens. [facility], was present and scrubbed throughout the case, critical for assistance with, positioning, exposure, retraction, instrumentation, and closure.The skin and subcutaneous tissue along the incision was injected with 20 cc of a mixture of Exparel and Marcaine solution, using a 20-gauge by 1-1/2 inch needle. We began the operation, with the knee flexed 130 degrees, by making the anterior midline incision starting at handbreadth above the patella going over the patella 1 cm medial to and 4 cm distal to the tibial tubercle. Small bleeders in the skin and the subcutaneous tissue identified and cauterized. Transverse retinaculum was incised and reflected medially and a medial parapatellar arthrotomy was accomplished. the patella was everted and theprepatellar fat pad resected. The superficial medial collateral ligament was then elevated from anterior to posterior along the proximal flare of the tibia and anterior half of the menisci resected. The knee was hyperflexed exposing bone on bone arthritis. Peripheral and notch osteophytes  as well as the cruciate ligaments were then resected. We continued to work our way around posteriorly along the proximal tibia, and externally rotated the tibia subluxing it out from underneath the femur. A McHale PCL retractor was placed through the notch and a lateral Hohmann retractor placed, and we then entered the proximal tibia in line with the Depuy starter drill in line with the axis of the tibia followed by an intramedullary guide rod and 0-degree posterior slope cutting guide. The tibial cutting guide, 4 degree posterior sloped, was pinned into place allowing resection of 6 mm of bone medially and 0 mm of bone laterally. Satisfied with the tibial resection, we then entered the distal femur 2 mm anterior to the PCL origin with the intramedullary guide rod and applied the distal femoral cutting guide set at 9 mm, with 5  degrees of valgus. This was pinned along the epicondylar axis. At this point, the distal femoral cut was accomplished without difficulty. We then sized for a #8R femoral component and pinned the guide in 0 degrees of external rotation. The chamfer cutting guide was pinned into place. The anterior, posterior, and chamfer cuts were accomplished without difficulty followed by the Attune RP box cutting guide and the box cut. We also removed posterior osteophytes from the posterior femoral condyles. The posterior capsule was injected with Exparel solution. The knee was brought into full extension. We checked our extension gap and fit a 5 mm bearing. Distracting in extension with a lamina spreader,  bleeders in the posterior capsule, Posterior medial and posterior lateral gutter were cauterized.  The transexamic acid-soaked sponge was then placed in the gap of the knee in extension. The knee was flexed 30. The posterior patella cut was accomplished with the 9.5 mm Attune cutting guide, sized for a 84mm dome, and the fixation pegs drilled.The knee was then once again hyperflexed exposing the proximal tibia. We sized for a # 8 tibial base plate, applied the smokestack and the conical reamer followed by the the Delta fin keel punch. We then hammered into place the Attune RP trial femoral component, drilled the lugs, inserted a  5 mm trial bearing, trial patellar button, and took the knee through range of motion from 0-130 degrees. Medial and lateral ligamentous stability was checked. No thumb pressure was required for patellar Tracking. The tourniquet was not used. All trial components were removed, mating surfaces irrigated with pulse lavage, and dried with suction and sponges. 10 cc of the Exparel solution was applied to the cancellus bone of the patella distal femur and proximal tibia.  After waiting 30 seconds, the bony surfaces were again, dried with sponges. A double batch of DePuy HV cement was mixed and applied to  all bony metallic mating surfaces except for the posterior condyles of the femur itself. In order, we hammered into place the tibial tray and removed excess cement, the femoral component and removed excess cement. The final Attune RP bearing was inserted, and the knee brought to full extension with compression. The patellar button was clamped into place, and excess cement removed. The knee was held at 30 flexion with compression, while the cement cured. The wound was irrigated out with normal saline solution pulse lavage. The rest of the Exparel was injected into the parapatellar arthrotomy, subcutaneous tissues, and periosteal tissues. The parapatellar arthrotomy was closed with running #1 Vicryl suture. The subcutaneous tissue with 3-0 undyed Vicryl suture, and the skin with running 3-0 SQ vicryl. An Aquacil and Ace wrap were  applied. The patient was taken to recovery room without difficulty.   [name] 01/27/2021, 6:54 AM

## 2021-01-27 NOTE — Discharge Instructions (Signed)
INSTRUCTIONS AFTER JOINT REPLACEMENT   o Remove items at home which could result in a fall. This includes throw rugs or furniture in walking pathways o ICE to the affected joint every three hours while awake for 30 minutes at a time, for at least the first 3-5 days, and then as needed for pain and swelling.  Continue to use ice for pain and swelling. You may notice swelling that will progress down to the foot and ankle.  This is normal after surgery.  Elevate your leg when you are not up walking on it.   o Continue to use the breathing machine you got in the hospital (incentive spirometer) which will help keep your temperature down.  It is common for your temperature to cycle up and down following surgery, especially at night when you are not up moving around and exerting yourself.  The breathing machine keeps your lungs expanded and your temperature down.   DIET:  As you were doing prior to hospitalization, we recommend a well-balanced diet.  DRESSING / WOUND CARE / SHOWERING  Keep the surgical dressing until follow up.  The dressing is water proof, so you can shower without any extra covering.  IF THE DRESSING FALLS OFF or the wound gets wet inside, change the dressing with sterile gauze.  Please use good hand washing techniques before changing the dressing.  Do not use any lotions or creams on the incision until instructed by your surgeon.    ACTIVITY  o Increase activity slowly as tolerated, but follow the weight bearing instructions below.   o No driving for 6 weeks or until further direction given by your physician.  You cannot drive while taking narcotics.  o No lifting or carrying greater than 10 lbs. until further directed by your surgeon. o Avoid periods of inactivity such as sitting longer than an hour when not asleep. This helps prevent blood clots.  o You may return to work once you are authorized by your doctor.     WEIGHT BEARING   Weight bearing as tolerated with assist  device (walker, cane, etc) as directed, use it as long as suggested by your surgeon or therapist, typically at least 4-6 weeks.   EXERCISES  Results after joint replacement surgery are often greatly improved when you follow the exercise, range of motion and muscle strengthening exercises prescribed by your doctor. Safety measures are also important to protect the joint from further injury. Any time any of these exercises cause you to have increased pain or swelling, decrease what you are doing until you are comfortable again and then slowly increase them. If you have problems or questions, call your caregiver or physical therapist for advice.   Rehabilitation is important following a joint replacement. After just a few days of immobilization, the muscles of the leg can become weakened and shrink (atrophy).  These exercises are designed to build up the tone and strength of the thigh and leg muscles and to improve motion. Often times heat used for twenty to thirty minutes before working out will loosen up your tissues and help with improving the range of motion but do not use heat for the first two weeks following surgery (sometimes heat can increase post-operative swelling).   These exercises can be done on a training (exercise) mat, on the floor, on a table or on a bed. Use whatever works the best and is most comfortable for you.    Use music or television while you are exercising so that   the exercises are a pleasant break in your day. This will make your life better with the exercises acting as a break in your routine that you can look forward to.   Perform all exercises about fifteen times, three times per day or as directed.  You should exercise both the operative leg and the other leg as well.  Exercises include:   . Quad Sets - Tighten up the muscle on the front of the thigh (Quad) and hold for 5-10 seconds.   . Straight Leg Raises - With your knee straight (if you were given a brace, keep it on),  lift the leg to 60 degrees, hold for 3 seconds, and slowly lower the leg.  Perform this exercise against resistance later as your leg gets stronger.  . Leg Slides: Lying on your back, slowly slide your foot toward your buttocks, bending your knee up off the floor (only go as far as is comfortable). Then slowly slide your foot back down until your leg is flat on the floor again.  . Angel Wings: Lying on your back spread your legs to the side as far apart as you can without causing discomfort.  . Hamstring Strength:  Lying on your back, push your heel against the floor with your leg straight by tightening up the muscles of your buttocks.  Repeat, but this time bend your knee to a comfortable angle, and push your heel against the floor.  You may put a pillow under the heel to make it more comfortable if necessary.   A rehabilitation program following joint replacement surgery can speed recovery and prevent re-injury in the future due to weakened muscles. Contact your doctor or a physical therapist for more information on knee rehabilitation.    CONSTIPATION  Constipation is defined medically as fewer than three stools per week and severe constipation as less than one stool per week.  Even if you have a regular bowel pattern at home, your normal regimen is likely to be disrupted due to multiple reasons following surgery.  Combination of anesthesia, postoperative narcotics, change in appetite and fluid intake all can affect your bowels.   YOU MUST use at least one of the following options; they are listed in order of increasing strength to get the job done.  They are all available over the counter, and you may need to use some, POSSIBLY even all of these options:    Drink plenty of fluids (prune juice may be helpful) and high fiber foods Colace 100 mg by mouth twice a day  Senokot for constipation as directed and as needed Dulcolax (bisacodyl), take with full glass of water  Miralax (polyethylene glycol)  once or twice a day as needed.  If you have tried all these things and are unable to have a bowel movement in the first 3-4 days after surgery call either your surgeon or your primary doctor.    If you experience loose stools or diarrhea, hold the medications until you stool forms back up.  If your symptoms do not get better within 1 week or if they get worse, check with your doctor.  If you experience "the worst abdominal pain ever" or develop nausea or vomiting, please contact the office immediately for further recommendations for treatment.   ITCHING:  If you experience itching with your medications, try taking only a single pain pill, or even half a pain pill at a time.  You can also use Benadryl over the counter for itching or also to   help with sleep.   TED HOSE STOCKINGS:  Use stockings on both legs until for at least 2 weeks or as directed by physician office. They may be removed at night for sleeping.  MEDICATIONS:  See your medication summary on the "After Visit Summary" that nursing will review with you.  You may have some home medications which will be placed on hold until you complete the course of blood thinner medication.  It is important for you to complete the blood thinner medication as prescribed.  PRECAUTIONS:  If you experience chest pain or shortness of breath - call 911 immediately for transfer to the hospital emergency department.   If you develop a fever greater that 101 F, purulent drainage from wound, increased redness or drainage from wound, foul odor from the wound/dressing, or calf pain - CONTACT YOUR SURGEON.                                                   FOLLOW-UP APPOINTMENTS:  If you do not already have a post-op appointment, please call the office for an appointment to be seen by your surgeon.  Guidelines for how soon to be seen are listed in your "After Visit Summary", but are typically between 1-4 weeks after surgery.  OTHER INSTRUCTIONS:   Knee  Replacement:  Do not place pillow under knee, focus on keeping the knee straight while resting. CPM instructions: 0-90 degrees, 2 hours in the morning, 2 hours in the afternoon, and 2 hours in the evening. Place foam block, curve side up under heel at all times except when in CPM or when walking.  DO NOT modify, tear, cut, or change the foam block in any way.  POST-OPERATIVE OPIOID TAPER INSTRUCTIONS: . It is important to wean off of your opioid medication as soon as possible. If you do not need pain medication after your surgery it is ok to stop day one. . Opioids include: o Codeine, Hydrocodone(Norco, Vicodin), Oxycodone(Percocet, oxycontin) and hydromorphone amongst others.  . Long term and even short term use of opiods can cause: o Increased pain response o Dependence o Constipation o Depression o Respiratory depression o And more.  . Withdrawal symptoms can include o Flu like symptoms o Nausea, vomiting o And more . Techniques to manage these symptoms o Hydrate well o Eat regular healthy meals o Stay active o Use relaxation techniques(deep breathing, meditating, yoga) . Do Not substitute Alcohol to help with tapering . If you have been on opioids for less than two weeks and do not have pain than it is ok to stop all together.  . Plan to wean off of opioids o This plan should start within one week post op of your joint replacement. o Maintain the same interval or time between taking each dose and first decrease the dose.  o Cut the total daily intake of opioids by one tablet each day o Next start to increase the time between doses. o The last dose that should be eliminated is the evening dose.     MAKE SURE YOU:  . Understand these instructions.  . Get help right away if you are not doing well or get worse.    Thank you for letting us be a part of your medical care team.  It is a privilege we respect greatly.  We hope these instructions will   help you stay on track for a fast  and full recovery!      General Anesthesia, Adult, Care After This sheet gives you information about how to care for yourself after your procedure. Your health care provider may also give you more specific instructions. If you have problems or questions, contact your health care provider. What can I expect after the procedure? After the procedure, the following side effects are common:  Pain or discomfort at the IV site.  Nausea.  Vomiting.  Sore throat.  Trouble concentrating.  Feeling cold or chills.  Feeling weak or tired.  Sleepiness and fatigue.  Soreness and body aches. These side effects can affect parts of the body that were not involved in surgery. Follow these instructions at home: For the time period you were told by your health care provider:  Rest.  Do not participate in activities where you could fall or become injured.  Do not drive or use machinery.  Do not drink alcohol.  Do not take sleeping pills or medicines that cause drowsiness.  Do not make important decisions or sign legal documents.  Do not take care of children on your own.   Eating and drinking  Follow any instructions from your health care provider about eating or drinking restrictions.  When you feel hungry, start by eating small amounts of foods that are soft and easy to digest (bland), such as toast. Gradually return to your regular diet.  Drink enough fluid to keep your urine pale yellow.  If you vomit, rehydrate by drinking water, juice, or clear broth. General instructions  If you have sleep apnea, surgery and certain medicines can increase your risk for breathing problems. Follow instructions from your health care provider about wearing your sleep device: ? Anytime you are sleeping, including during daytime naps. ? While taking prescription pain medicines, sleeping medicines, or medicines that make you drowsy.  Have a responsible adult stay with you for the time you are  told. It is important to have someone help care for you until you are awake and alert.  Return to your normal activities as told by your health care provider. Ask your health care provider what activities are safe for you.  Take over-the-counter and prescription medicines only as told by your health care provider.  If you smoke, do not smoke without supervision.  Keep all follow-up visits as told by your health care provider. This is important. Contact a health care provider if:  You have nausea or vomiting that does not get better with medicine.  You cannot eat or drink without vomiting.  You have pain that does not get better with medicine.  You are unable to pass urine.  You develop a skin rash.  You have a fever.  You have redness around your IV site that gets worse. Get help right away if:  You have difficulty breathing.  You have chest pain.  You have blood in your urine or stool, or you vomit blood. Summary  After the procedure, it is common to have a sore throat or nausea. It is also common to feel tired.  Have a responsible adult stay with you for the time you are told. It is important to have someone help care for you until you are awake and alert.  When you feel hungry, start by eating small amounts of foods that are soft and easy to digest (bland), such as toast. Gradually return to your regular diet.  Drink enough fluid to keep   pale yellow.  Return to your normal activities as told by your health care provider. Ask your health care provider what activities are safe for you. This information is not intended to replace advice given to you by your health care provider. Make sure you discuss any questions you have with your health care provider. Document Revised: 07/04/2020 Document Reviewed: 02/01/2020 Elsevier Patient Education  2021 [employer].

## 2021-01-27 NOTE — Evaluation (Signed)
Physical Therapy Evaluation Patient Details Name: Brian Mcdonald MRN: [id number] DOB: September 01, 1961 Today's Date: 01/27/2021   History of Present Illness  Pt is 60 yo male s/p R TKA on 01/27/21.  Clinical Impression  Pt is s/p R TKA resulting in the deficits listed below (see PT Problem List). Pt seen in PACU for possible same day discharge.  He demonstrated safe gait, transfers, and stairs.  Good understanding of activity, exercises, safety, and use of DME.  Pt demonstrated mobility necessary to return home with family but  Pt will benefit from skilled PT to increase their independence and safety with mobility while hospitalized.       Follow Up Recommendations Follow surgeon's recommendation for DC plan and follow-up therapies;Supervision for mobility/OOB    Equipment Recommendations  None recommended by PT (has RW and BSC)    Recommendations for Other Services       Precautions / Restrictions Precautions Precautions: Fall Restrictions Weight Bearing Restrictions: Yes RLE Weight Bearing: Weight bearing as tolerated      Mobility  Bed Mobility Overal bed mobility: Needs Assistance Bed Mobility: Supine to Sit;Sit to Supine     Supine to sit: Supervision;HOB elevated Sit to supine: Supervision;HOB elevated        Transfers Overall transfer level: Needs assistance Equipment used: Rolling walker (2 wheeled) Transfers: Sit to/from Stand Sit to Stand: Min guard         General transfer comment: cues for hand placment and R LE managment  Ambulation/Gait Ambulation/Gait assistance: Min guard Gait Distance (Feet): 125 Feet Assistive device: Rolling walker (2 wheeled) Gait Pattern/deviations: Step-to pattern;Decreased weight shift to right Gait velocity: decreased   General Gait Details: Educated on sequencing and RW use.  Demonstrated slow but steady gait consistent with post op  Stairs Stairs: Yes Stairs assistance: Min guard Stair Management: Two rails;Step to  pattern;Forwards;With walker Number of Stairs: 4 General stair comments: Performed 3 with bil rails and 1 with RW to simulate threshold  Wheelchair Mobility    Modified Rankin (Stroke Patients Only)       Balance Overall balance assessment: Needs assistance Sitting-balance support: No upper extremity supported Sitting balance-Leahy Scale: Normal     Standing balance support: Bilateral upper extremity supported Standing balance-Leahy Scale: Fair Standing balance comment: RW for WBAT; balance steady                             Pertinent Vitals/Pain Pain Assessment: 0-10 Pain Score: 1  Pain Location: R knee Pain Descriptors / Indicators: Discomfort;Sore Pain Intervention(s): Limited activity within patient's tolerance;Monitored during session;Ice applied    Home Living Family/patient expects to be discharged to:: Private residence Living Arrangements: Alone Available Help at Discharge: Family;Available 24 hours/day (will stay with daughter) Type of Home: House Home Access: Other (comment)   Entrance Stairs-Number of Steps: threshold Home Layout: One level;Laundry or work area in [employer] Equipment: Environmental consultant - 2 wheels;Cane - single point;Bedside commode Additional Comments: information on daughter's house    Prior Function Level of Independence: Independent         Comments: Independent ; was working but some limitations from his normal duties due to knee     Hand Dominance        Extremity/Trunk Assessment   Upper Extremity Assessment Upper Extremity Assessment: Overall WFL for tasks assessed    Lower Extremity Assessment Lower Extremity Assessment: RLE deficits/detail RLE Deficits / Details: Consistent with post op changes; ROM: ankle and  hip WFL, knee 0 to 90 degrees; MMT: ankle 5/5, hip and knee 3/5 RLE Sensation: decreased light touch (reports still mild numbness groin and buttock)    Cervical / Trunk Assessment Cervical / Trunk  Assessment: Normal  Communication      Cognition Arousal/Alertness: Awake/alert Behavior During Therapy: WFL for tasks assessed/performed Overall Cognitive Status: Within Functional Limits for tasks assessed                                        General Comments General comments (skin integrity, edema, etc.): Educated on HEP, safe ice use, no pivots, car transfers, safety, gradual progression - provided written instructions    Exercises Total Joint Exercises Ankle Circles/Pumps: AROM;Both;10 reps;Supine Quad Sets: AROM;Right;5 reps;Supine Heel Slides: AROM;Right;5 reps;Supine Hip ABduction/ADduction: AROM;Right;5 reps;Supine Long Arc Quad: AROM;Right;5 reps;Supine Knee Flexion: AROM;Right;5 reps;Supine Goniometric ROM: R knee 0 to 90 degrees   Assessment/Plan    PT Assessment Patient needs continued PT services  PT Problem List Decreased strength;Decreased mobility;Decreased range of motion;Decreased knowledge of precautions;Decreased activity tolerance;Decreased balance;Decreased knowledge of use of DME;Pain       PT Treatment Interventions DME instruction;Therapeutic activities;Gait training;Therapeutic exercise;Patient/family education;Modalities;Stair training;Balance training;Functional mobility training    PT Goals (Current goals can be found in the Care Plan section)  Acute Rehab PT Goals Patient Stated Goal: return home PT Goal Formulation: With patient Time For Goal Achievement: 02/10/21 Potential to Achieve Goals: Good    Frequency 7X/week   Barriers to discharge        Co-evaluation               AM-PAC PT "6 Clicks" Mobility  Outcome Measure Help needed turning from your back to your side while in a flat bed without using bedrails?: None Help needed moving from lying on your back to sitting on the side of a flat bed without using bedrails?: A Little Help needed moving to and from a bed to a chair (including a wheelchair)?: A  Little Help needed standing up from a chair using your arms (e.g., wheelchair or bedside chair)?: A Little Help needed to walk in hospital room?: A Little Help needed climbing 3-5 steps with a railing? : A Little 6 Click Score: 19    End of Session Equipment Utilized During Treatment: Gait belt Activity Tolerance: Patient tolerated treatment well Patient left: in bed;with call bell/phone within reach;with nursing/sitter in room Nurse Communication: Mobility status PT Visit Diagnosis: Other abnormalities of gait and mobility (R26.89);Pain;Muscle weakness (generalized) (M62.81) Pain - Right/Left: Right Pain - part of body: Knee    Time: 1104-1140 PT Time Calculation (min) (ACUTE ONLY): 36 min   Charges:   PT Evaluation $PT Eval Moderate Complexity: 1 Mod PT Treatments $Gait Training: 8-22 mins        Anise Salvo, PT Acute Rehab Services Pager [phone] [facility] [phone]    Rayetta Humphrey 01/27/2021, 11:50 AM

## 2021-01-27 NOTE — Anesthesia Procedure Notes (Signed)
Spinal  Patient location during procedure: OR Start time: 01/27/2021 7:20 AM End time: 01/27/2021 7:24 AM Reason for block: surgical anesthesia Staffing Anesthesiologist: Trevor Iha, MD Preanesthetic Checklist Completed: patient identified, IV checked, site marked, risks and benefits discussed, surgical consent, monitors and equipment checked, pre-op evaluation and timeout performed Spinal Block Patient position: sitting Prep: DuraPrep Patient monitoring: heart rate, cardiac monitor, continuous pulse ox and blood pressure Approach: midline Location: L3-4 Injection technique: single-shot Needle Needle type: Sprotte  Needle gauge: 24 G Needle length: 9 cm Needle insertion depth: 6 cm Assessment Sensory level: T4 Events: CSF return Additional Notes 1 Attempt

## 2021-01-27 NOTE — Anesthesia Procedure Notes (Addendum)
Anesthesia Regional Block: Adductor canal block   Pre-Anesthetic Checklist: ,, timeout performed, Correct Patient, Correct Site, Correct Laterality, Correct Procedure, Correct Position, site marked, Risks and benefits discussed,  Surgical consent,  Pre-op evaluation,  At surgeon's request and post-op pain management  Laterality: Lower and Right  Prep: chloraprep       Needles:  Injection technique: Single-shot  Needle Type: Echogenic Needle     Needle Length: 9cm  Needle Gauge: 22     Additional Needles:   Procedures:,,,, ultrasound used (permanent image in chart),,,,  Narrative:  Start time: 01/27/2021 6:45 AM End time: 01/27/2021 6:50 AM Injection made incrementally with aspirations every 5 mL.  Performed by: Personally  Anesthesiologist: Trevor Iha, MD  Additional Notes: Block assessed prior to surgery. Pt tolerated procedure well.

## 2021-01-27 NOTE — Anesthesia Postprocedure Evaluation (Signed)
Anesthesia Post Note  Patient: Brian Mcdonald  Procedure(s) Performed: RIGHT TOTAL KNEE ARTHROPLASTY (Right Knee)     Patient location during evaluation: Nursing Unit Anesthesia Type: Spinal Level of consciousness: oriented and awake and alert Pain management: pain level controlled Vital Signs Assessment: post-procedure vital signs reviewed and stable Respiratory status: spontaneous breathing and respiratory function stable Cardiovascular status: blood pressure returned to baseline and stable Postop Assessment: no headache, no backache, no apparent nausea or vomiting and patient able to bend at knees Anesthetic complications: no   No complications documented.  Last Vitals:  Vitals:   01/27/21 0945 01/27/21 1000  BP: 104/72 118/79  Pulse: 60 63  Resp: 15 20  Temp: 36.5 C 36.6 C  SpO2: 96% 95%    Last Pain:  Vitals:   01/27/21 1000  TempSrc: Oral  PainSc: 0-No pain                 Trevor Iha

## 2021-01-27 NOTE — Anesthesia Procedure Notes (Signed)
Procedure Name: MAC Date/Time: 01/27/2021 7:18 AM Performed by: Lollie Sails, CRNA Pre-anesthesia Checklist: Patient identified, Emergency Drugs available, Suction available, Patient being monitored and Timeout performed Oxygen Delivery Method: Simple face mask

## 2021-01-28 ENCOUNTER — Encounter (HOSPITAL_COMMUNITY): Payer: Self-pay | Admitting: Orthopedic Surgery

## 2022-02-11 IMAGING — MR MR KNEE*R* W/O CM
5 of 8 series · 21 of 40 positions shown · non-contrast
Comparison: Plain films right knee 05/01/2020.

CLINICAL DATA: Right knee pain since the patient fell out of a
trailer 05/01/2020.

EXAM:
MRI OF THE RIGHT KNEE WITHOUT CONTRAST
TECHNIQUE: Multiplanar, multisequence MR imaging of the knee was performed. No
intravenous contrast was administered.

[Series 3: T2 fat-sat · axial · 4.0mm · 0.29mm/px · z∈[-75,+48]mm · 5 of 29 slices shown (1 of 2)]
[im 1/29]
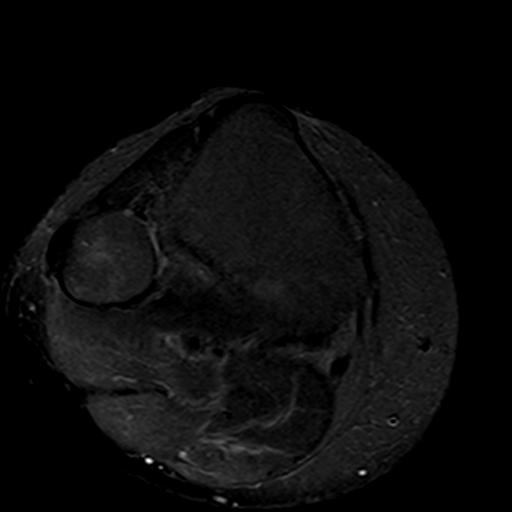
[im 8/29]
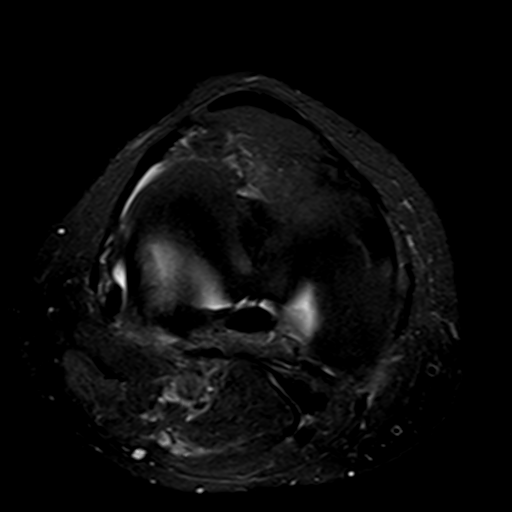
[im 15/29]
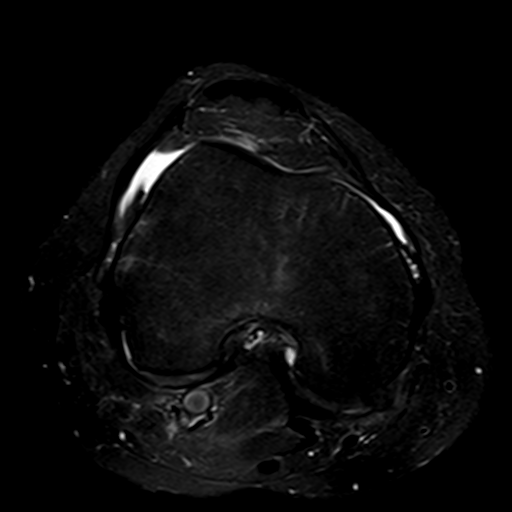
[im 22/29]
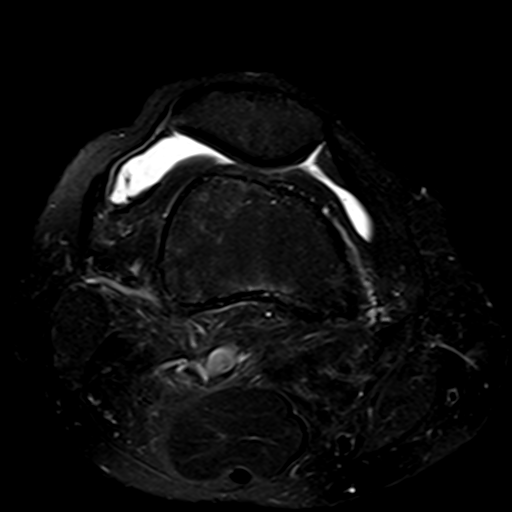
[im 29/29]
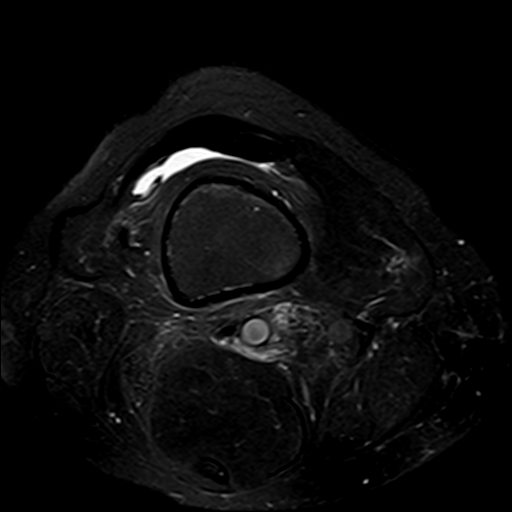

[Series 4: T2 fat-sat · axial · 4.0mm · 0.29mm/px · z∈[-75,-44]mm · 2 of 29 slices shown (2 of 2)]
[im 1/29]
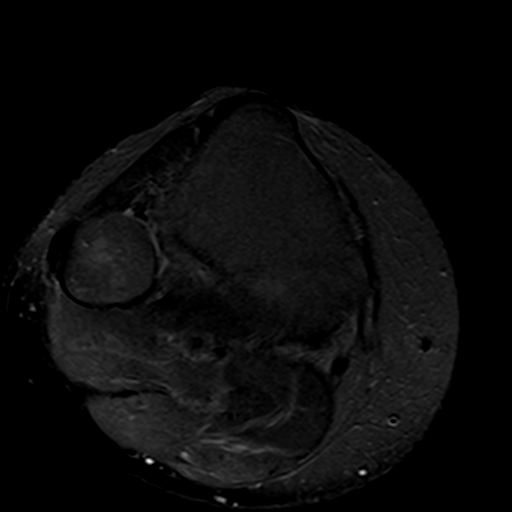
[im 8/29]
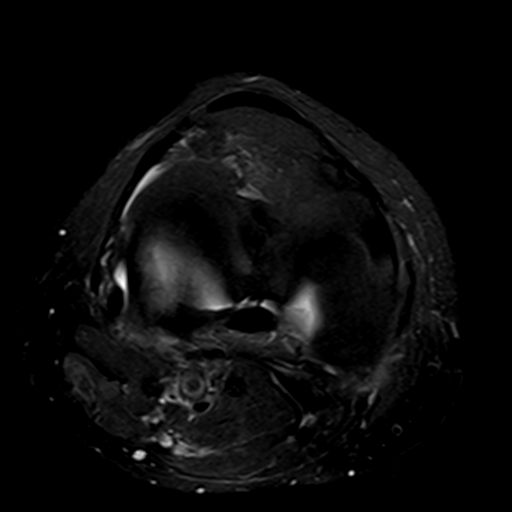

[Series 7: PD fat-sat · coronal · 3.0mm · 0.39mm/px · 6 of 28 slices shown (1 of 3)]
[im 1/28]
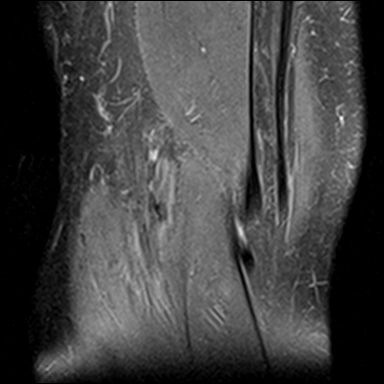
[im 6/28]
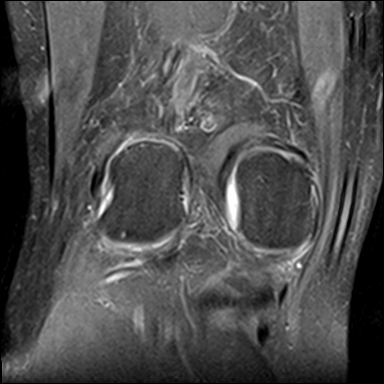
[im 11/28]
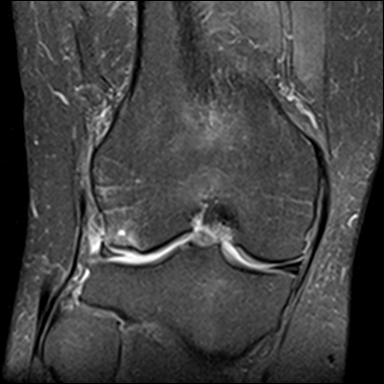
[im 17/28]
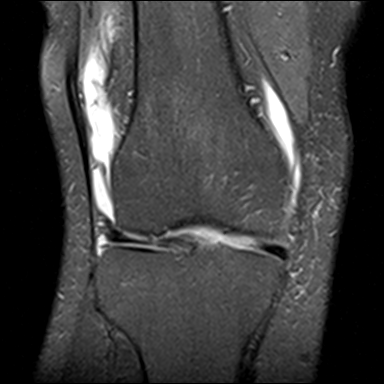
[im 22/28]
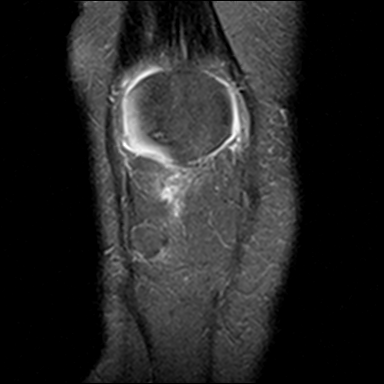
[im 28/28]
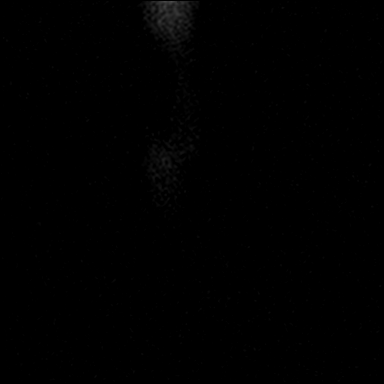

[Series 8: PD fat-sat · sagittal · 3.0mm · 0.29mm/px · 6 of 30 slices shown (2 of 3)]
[im 1/30]
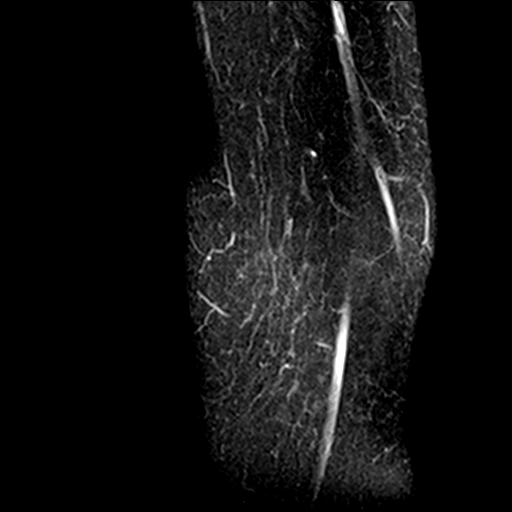
[im 6/30]
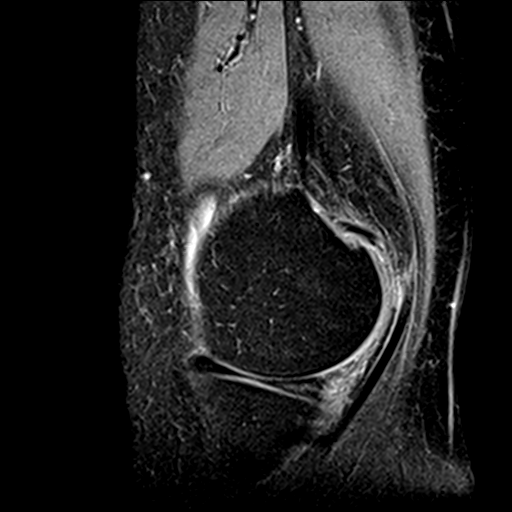
[im 12/30]
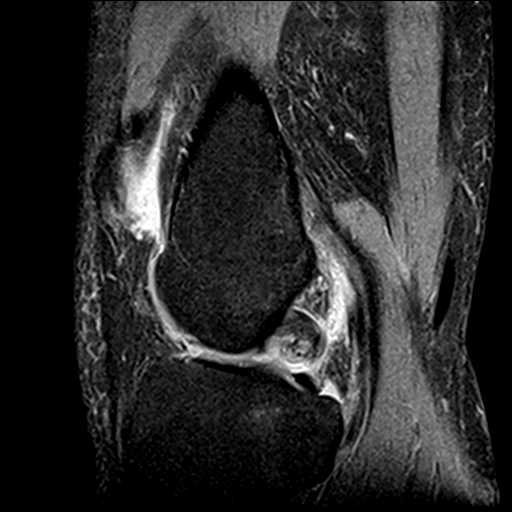
[im 18/30]
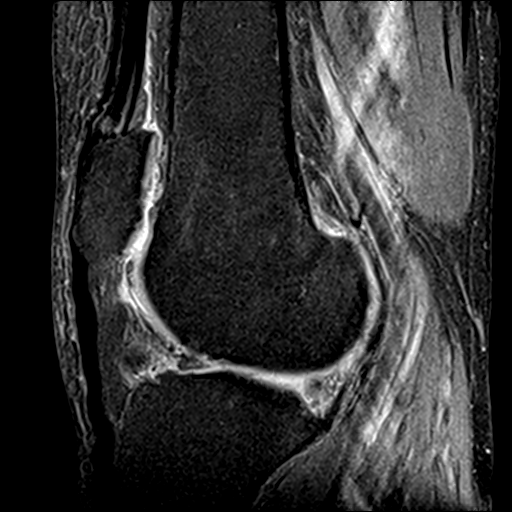
[im 24/30]
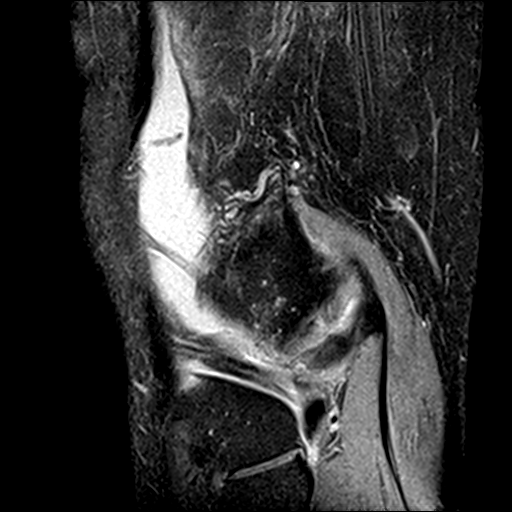
[im 30/30]
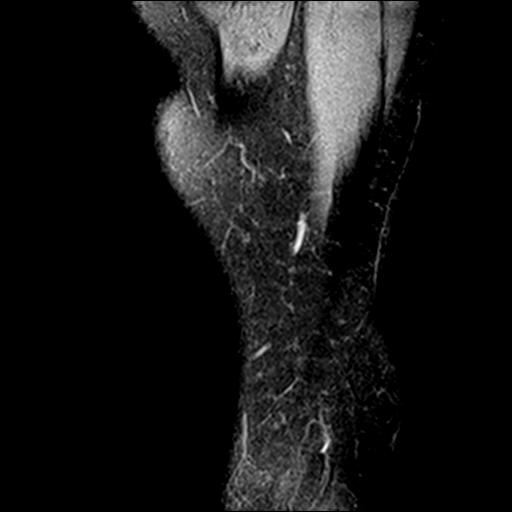

[Series 10: PD fat-sat · oblique · 2.0mm · 0.29mm/px · 2 of 11 slices shown (3 of 3)]
[im 1/11]
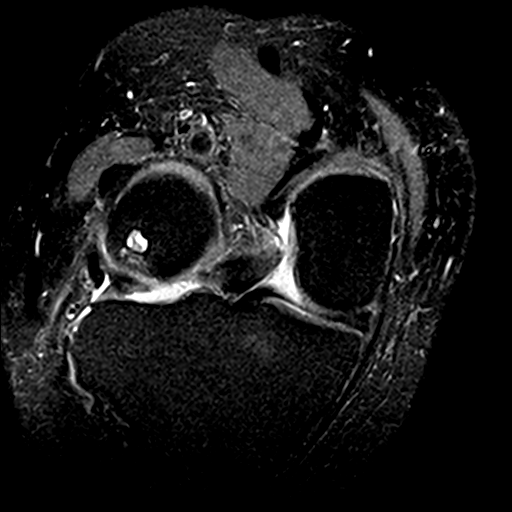
[im 11/11]
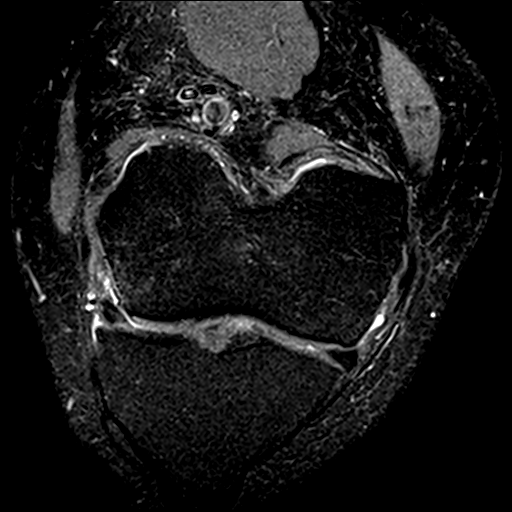

[21 of 40 positions shown; findings below may reference images not displayed]

FINDINGS: MENISCI

Medial meniscus: Intact. Intrasubstance degenerative signal in the
posterior horn is noted.

Lateral meniscus: There is a radial tear through the root of the
posterior horn. Radial tearing is also seen along the free edge of
the posterior horn. The body is degenerated, diminutive and extruded
peripherally. Horizontal tear in the body reaches the meniscal
undersurface.

LIGAMENTS

Cruciates:  The ACL is completely torn.  The PCL is intact.

Collaterals:  Intact.

CARTILAGE

Patellofemoral: Fraying and irregularity are worst along the lateral
patellar facet.

Medial:  Mildly degenerated.

Lateral:  Markedly degenerated.

Joint: Small to moderate joint effusion. A 1.8 cm in diameter loose
body is seen in the anterior aspect of the lateral compartment.

Popliteal Fossa:  No Baker's cyst.

Extensor Mechanism:  Intact.

Bones: No acute bony abnormality. There is flattening of the
subchondral bone plate in the periphery of the weight-bearing
lateral condyle with some subchondral cyst formation. Fluid
undermines a segment of cortical bone measuring 1 cm transverse by
1.1 cm AP.

Other: None.
IMPRESSION: Extensive tearing of the posterior horn and body of the lateral
meniscus as described above.

Chronic, complete ACL tear.

Osteoarthritis about the knee is advanced for age and most severe in
the lateral compartment where there is a large loose body
anteriorly. Fluid undermining cortical bone along the posterior
weight-bearing lateral femoral condyle is consistent with
delamination and worrisome for fragment instability.

## 2022-04-21 IMAGING — DX DG CHEST 2V
2 series · 2 of 2 positions shown · non-contrast
Comparison: None.

CLINICAL DATA: Preop knee surgery

EXAM:
CHEST - 2 VIEW

[chest pa]
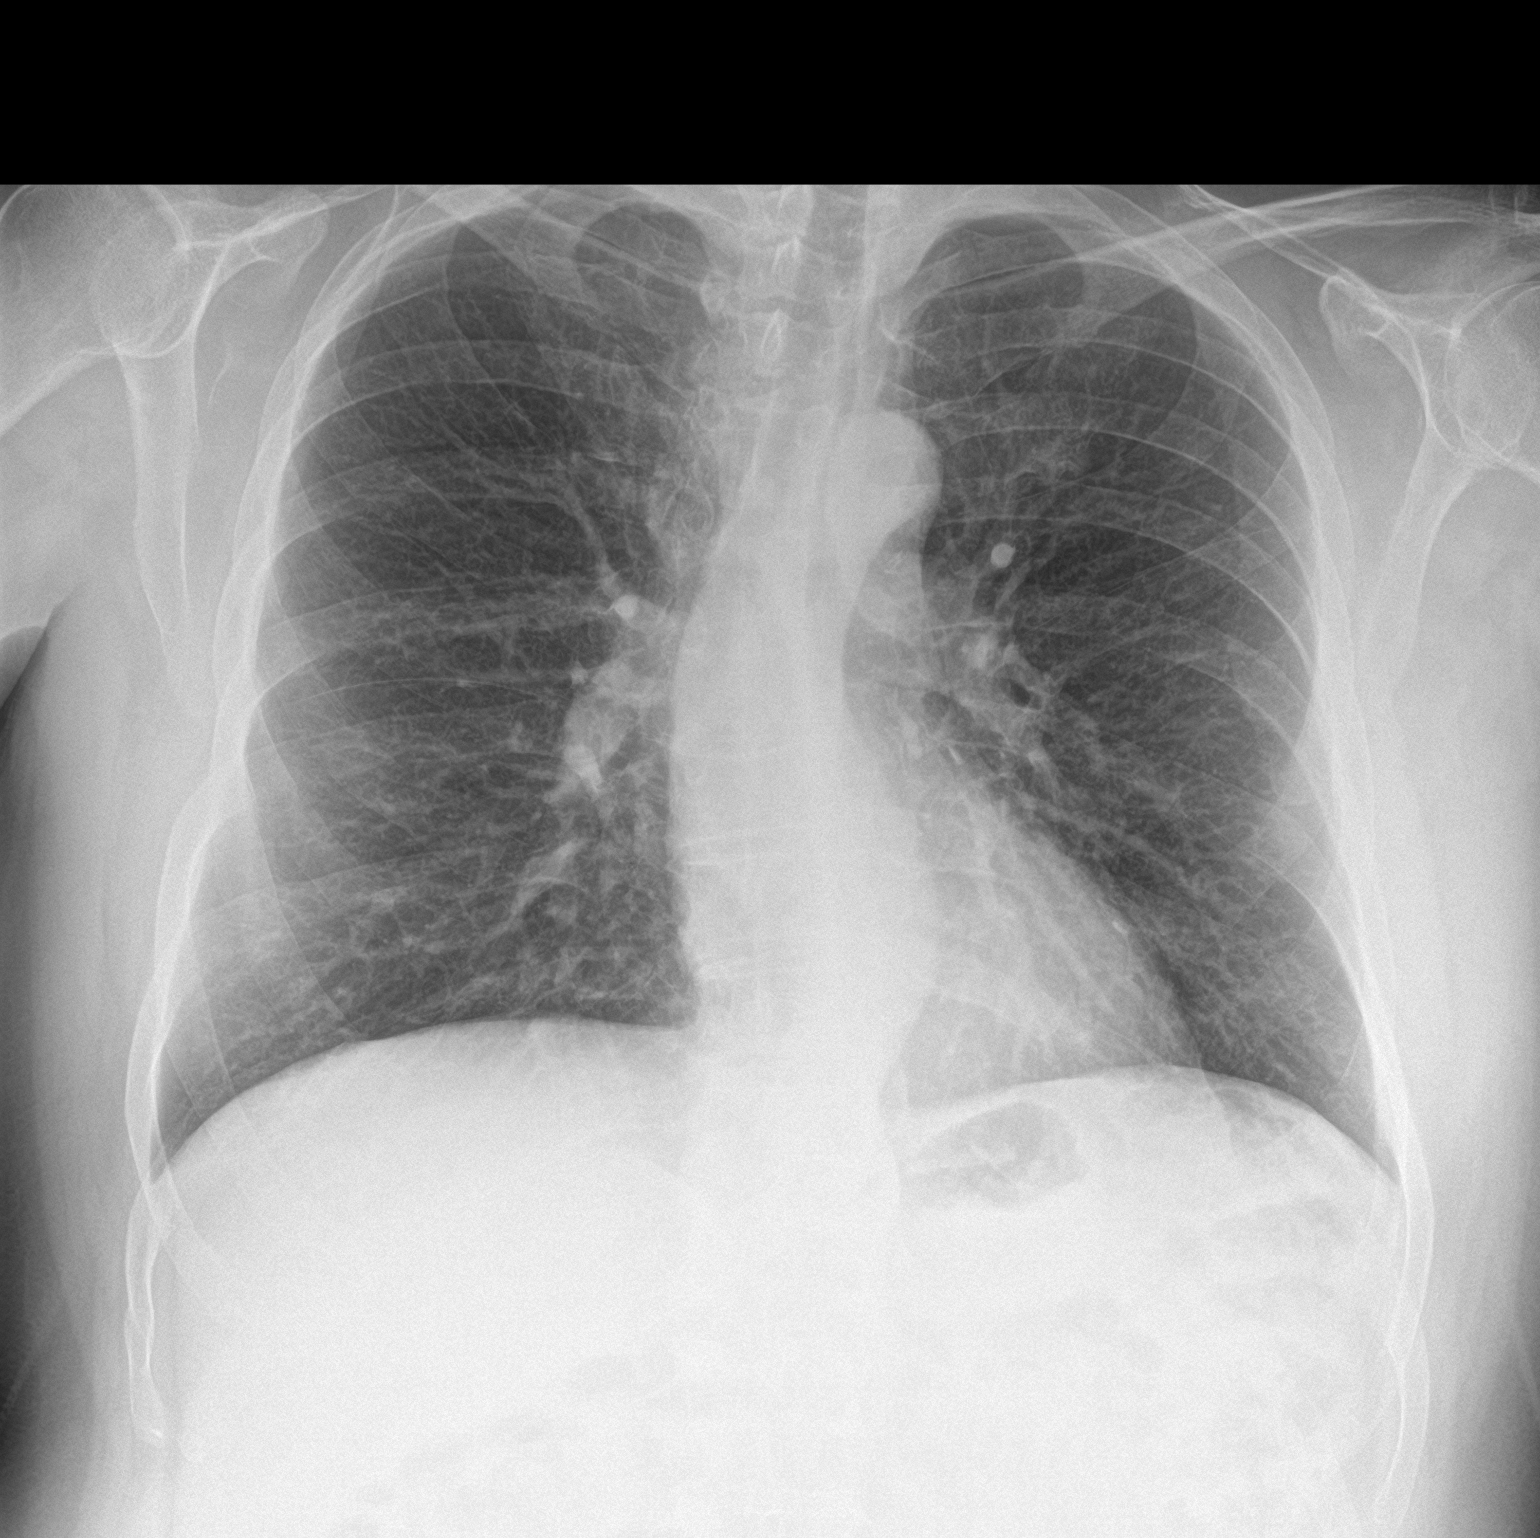

[chest lat]
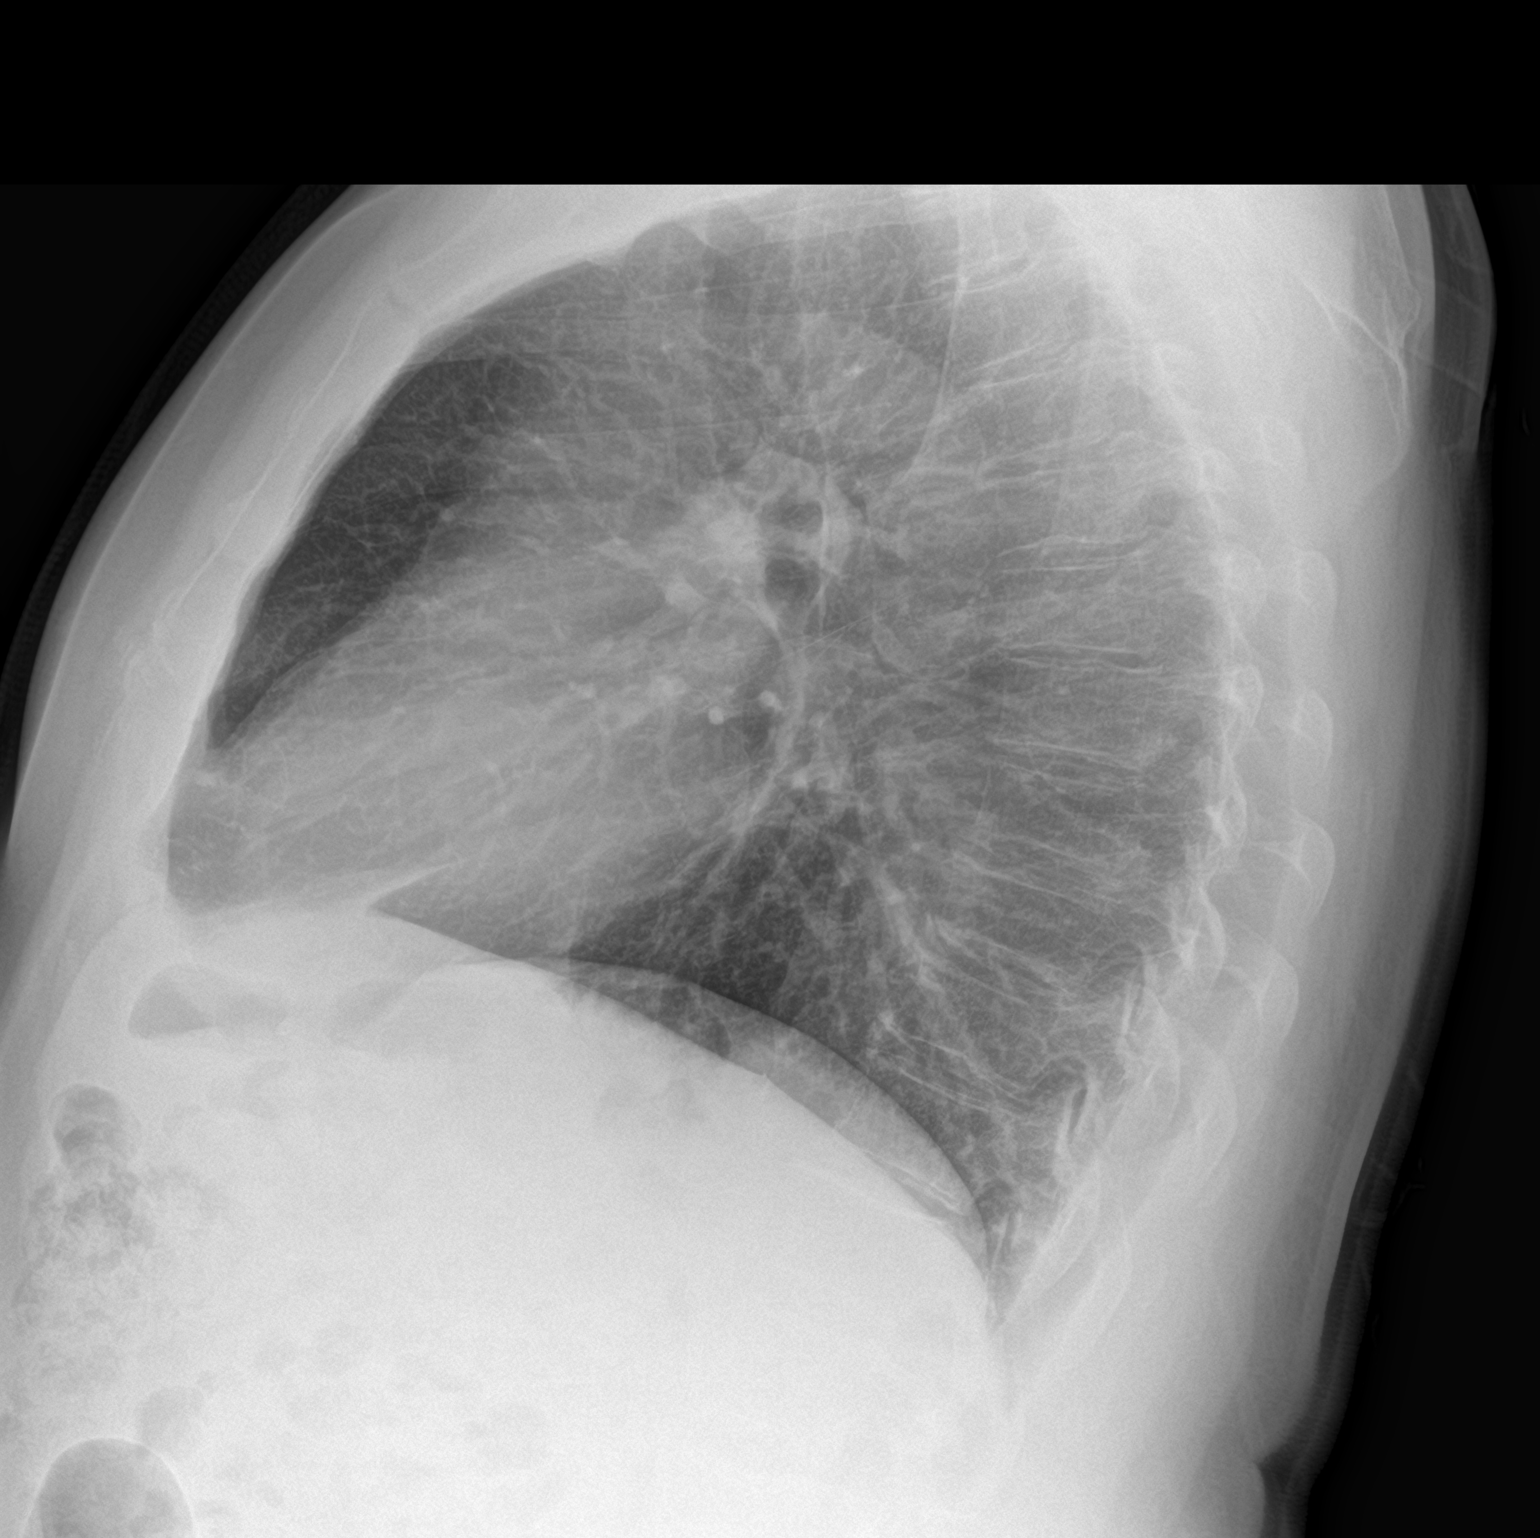

[2 of 2 positions shown; findings below may reference images not displayed]

FINDINGS: The heart size and mediastinal contours are within normal limits.
Both lungs are clear. The visualized skeletal structures are
unremarkable.
IMPRESSION: No active cardiopulmonary disease.
